# Patient Record
Sex: Female | Born: 1980 | Race: Black or African American | Hispanic: No | Marital: Single | Smoking: Current every day smoker
Health system: Southern US, Community
[De-identification: ages and names within clinical notes are randomized; demographics above are authoritative.]

## PROBLEM LIST (undated history)

## (undated) DIAGNOSIS — M549 Dorsalgia, unspecified: Secondary | ICD-10-CM

## (undated) DIAGNOSIS — G8929 Other chronic pain: Secondary | ICD-10-CM

## (undated) DIAGNOSIS — F319 Bipolar disorder, unspecified: Secondary | ICD-10-CM

## (undated) DIAGNOSIS — F419 Anxiety disorder, unspecified: Secondary | ICD-10-CM

## (undated) DIAGNOSIS — F4310 Post-traumatic stress disorder, unspecified: Secondary | ICD-10-CM

## (undated) DIAGNOSIS — K529 Noninfective gastroenteritis and colitis, unspecified: Secondary | ICD-10-CM

---

## 2005-09-26 ENCOUNTER — Emergency Department: Payer: Self-pay | Admitting: Emergency Medicine

## 2011-01-08 ENCOUNTER — Emergency Department: Payer: Self-pay | Admitting: Emergency Medicine

## 2011-02-14 ENCOUNTER — Emergency Department: Payer: Self-pay | Admitting: Emergency Medicine

## 2011-07-26 ENCOUNTER — Emergency Department: Payer: Self-pay | Admitting: Emergency Medicine

## 2011-07-26 LAB — CBC
HCT: 43.1 % (ref 35.0–47.0)
HGB: 14.3 g/dL (ref 12.0–16.0)
MCH: 30.7 pg (ref 26.0–34.0)
MCHC: 33.2 g/dL (ref 32.0–36.0)
MCV: 92 fL (ref 80–100)
RBC: 4.67 10*6/uL (ref 3.80–5.20)

## 2011-07-26 LAB — URINALYSIS, COMPLETE
Bacteria: NONE SEEN
Bilirubin,UR: NEGATIVE
Glucose,UR: NEGATIVE mg/dL (ref 0–75)
Ketone: NEGATIVE
Nitrite: NEGATIVE
Ph: 8 (ref 4.5–8.0)
Protein: NEGATIVE

## 2011-07-26 LAB — COMPREHENSIVE METABOLIC PANEL
Albumin: 3.6 g/dL (ref 3.4–5.0)
Alkaline Phosphatase: 49 U/L — ABNORMAL LOW (ref 50–136)
BUN: 9 mg/dL (ref 7–18)
Calcium, Total: 9.2 mg/dL (ref 8.5–10.1)
Chloride: 105 mmol/L (ref 98–107)
EGFR (Non-African Amer.): >60
Glucose: 89 mg/dL (ref 65–99)
Osmolality: 278 (ref 275–301)
SGPT (ALT): 29 U/L
Sodium: 140 mmol/L (ref 136–145)

## 2011-07-26 LAB — PREGNANCY, URINE: Pregnancy Test, Urine: NEGATIVE m[IU]/mL

## 2015-04-07 ENCOUNTER — Encounter (HOSPITAL_COMMUNITY): Payer: Self-pay | Admitting: Adult Health

## 2015-04-07 ENCOUNTER — Emergency Department (HOSPITAL_COMMUNITY)
Admission: EM | Admit: 2015-04-07 | Discharge: 2015-04-07 | Disposition: A | Discharge status: Home / Self Care | Payer: Medicaid Other | Attending: Emergency Medicine | Admitting: Emergency Medicine

## 2015-04-07 DIAGNOSIS — K029 Dental caries, unspecified: Secondary | ICD-10-CM | POA: Insufficient documentation

## 2015-04-07 DIAGNOSIS — R110 Nausea: Secondary | ICD-10-CM | POA: Insufficient documentation

## 2015-04-07 DIAGNOSIS — K0889 Other specified disorders of teeth and supporting structures: Secondary | ICD-10-CM | POA: Diagnosis not present

## 2015-04-07 DIAGNOSIS — K047 Periapical abscess without sinus: Secondary | ICD-10-CM | POA: Diagnosis not present

## 2015-04-07 MED ORDER — NAPROXEN 500 MG PO TABS: 500.0000 mg | ORAL_TABLET | Freq: Two times a day (BID) | ORAL | Status: DC | PRN

## 2015-04-07 MED ORDER — HYDROCODONE-ACETAMINOPHEN 5-325 MG PO TABS
1.0000 | ORAL_TABLET | Freq: Once | ORAL | Status: AC
  Administered 2015-04-07: 1 via ORAL
  Filled 2015-04-07: qty 1

## 2015-04-07 MED ORDER — HYDROCODONE-ACETAMINOPHEN 5-325 MG PO TABS: 1.0000 | ORAL_TABLET | Freq: Four times a day (QID) | ORAL | Status: DC | PRN

## 2015-04-07 MED ORDER — DOXYCYCLINE HYCLATE 100 MG PO CAPS: 100.0000 mg | ORAL_CAPSULE | Freq: Two times a day (BID) | ORAL | Status: DC

## 2015-04-07 NOTE — ED Provider Notes (Signed)
CSN: [id number]     Arrival date & time 04/07/15  2056 History  By signing my name below, I, Julien Nordmann, attest that this documentation has been prepared under the direction and in the presence of Ardis Lawley Camprubi-Soms, PA-C. Electronically Signed: Julien Nordmann, ED Scribe. 04/07/2015. 9:15 PM.    Chief Complaint  Patient presents with  . Dental Pain      Patient is a 34 y.o. female presenting with tooth pain. The history is provided by the patient. No language interpreter was used.  Dental Pain Location:  Lower Lower teeth location:  31/RL 2nd molar Quality:  Sharp, throbbing and shooting Severity:  Moderate Onset quality:  Gradual Duration:  3 days Timing:  Constant Progression:  Worsening Chronicity:  New Context: dental caries and poor dentition   Context: not abscess   Relieved by:  Nothing Worsened by:  Cold food/drink and jaw movement Ineffective treatments:  NSAIDs Associated symptoms: gum swelling   Associated symptoms: no difficulty swallowing, no drooling, no facial swelling, no fever, no neck pain, no neck swelling, no oral bleeding and no trismus   Risk factors: lack of dental care   Risk factors: no immunosuppression and no smoking    HPI Comments: SAYDA GRABLE is a 34 y.o. female who presents to the Emergency Department complaining of constant, 10/10, gradual worsening, throbbing, stabbing and shooting right lower molar pain onset 3 days ago. She has been having gum swelling and nausea that she feels is due to the pain. Pt notes the pain radiates up the right side of her face. She states that the pain is worse with movement and cold foods/fluids. Pt has tried ibuprofen to alleviate the pain with no relief. She endorses having a broken tooth on that side which is the cause of her pain. Denies trouble swallowing, drooling, trismus, gum drainage, neck swelling/pain, ear pain/drainage, sore throat, fevers, chills, CP, SOB, abd pain, V/D/C, hematuria, dysuria,  myalgias, arthralgias, numbness, tingling, weakness, or skin changes. Pt is a non smoker. Doesn't have a dentist.   No past medical history on file. No past surgical history on file. No family history on file. Social History  Substance Use Topics  . Smoking status: Not on file  . Smokeless tobacco: Not on file  . Alcohol Use: Not on file   OB History    No data available     Review of Systems  Constitutional: Negative for fever and chills.  HENT: Positive for dental problem. Negative for drooling, ear discharge, ear pain, facial swelling, sore throat and trouble swallowing.   Respiratory: Negative for shortness of breath.   Cardiovascular: Negative for chest pain.  Gastrointestinal: Positive for nausea (from pain). Negative for vomiting, abdominal pain, diarrhea and constipation.  Genitourinary: Negative for dysuria and hematuria.  Musculoskeletal: Negative for neck pain.  Skin: Negative for color change.  Allergic/Immunologic: Negative for immunocompromised state.  Neurological: Negative for weakness and numbness.   10 Systems reviewed and are negative for acute change except as noted in the HPI.    Allergies  Review of patient's allergies indicates not on file.  Home Medications   Prior to Admission medications   Not on File   Triage vitals: BP 125/80 mmHg  Pulse 85  Temp(Src) 98 F (36.7 C) (Oral)  Resp 16  Ht 5' 2"  (1.575 m)  Wt 170 lb (77.111 kg)  BMI 31.09 kg/m2  SpO2 100% Physical Exam  Constitutional: She is oriented to person, place, and time. Vital signs are normal.  She appears well-developed and well-nourished.  Non-toxic appearance. No distress.  Afebrile, nontoxic, NAD  HENT:  Head: Normocephalic and atraumatic.  Nose: Nose normal.  Mouth/Throat: Uvula is midline, oropharynx is clear and moist and mucous membranes are normal. No trismus in the jaw. Dental caries present. No dental abscesses or uvula swelling.    Right lower molar number 31 decayed  with moderate TTP, with mild surrounding gingival erythema and swelling but without evidence of abscess, no evidence of Ludwig's. Nose clear. Oropharynx clear and moist, without uvular swelling or deviation, no trismus or drooling, no tonsillar swelling or erythema, no exudates.  No facial swelling noted  Eyes: Conjunctivae and EOM are normal. Right eye exhibits no discharge. Left eye exhibits no discharge.  Neck: Normal range of motion. Neck supple.  Cardiovascular: Normal rate.   Pulmonary/Chest: Effort normal. No respiratory distress.  Abdominal: Normal appearance. She exhibits no distension.  Musculoskeletal: Normal range of motion.  Lymphadenopathy:       Head (right side): Submandibular adenopathy present.  Reactive right sided submandibular LAD which is mildly TTP.  Neurological: She is alert and oriented to person, place, and time. She has normal strength. No sensory deficit.  Skin: Skin is warm, dry and intact. No rash noted.  Psychiatric: She has a normal mood and affect. Her behavior is normal.  Nursing note and vitals reviewed.   ED Course  Procedures  DIAGNOSTIC STUDIES: Oxygen Saturation is 100% on RA, normal by my interpretation.  COORDINATION OF CARE:  9:15 PM Discussed treatment plan with pt at bedside and pt agreed to plan.    Labs Review Labs Reviewed - No data to display  Imaging Review No results found. I have personally reviewed and evaluated these images and lab results as part of my medical decision-making.   EKG Interpretation None      MDM   Final diagnoses:  Dental decay  Pain due to dental caries  Dental infection    34 y.o. female here with Dental pain associated with dental decay and possible dental infection/early abscess with patient afebrile, non toxic appearing and swallowing secretions well. I gave patient referral to dentist and stressed the importance of dental follow up for ultimate management of dental pain.  I have also discussed  reasons to return immediately to the ER.  Patient expresses understanding and agrees with plan.  I will also give doxycycline and pain control.    I personally performed the services described in this documentation, which was scribed in my presence. The recorded information has been reviewed and is accurate.  BP 125/80 mmHg  Pulse 85  Temp(Src) 98 F (36.7 C) (Oral)  Resp 16  Ht 5' 2"  (1.575 m)  Wt 77.111 kg  BMI 31.09 kg/m2  SpO2 100%  Meds ordered this encounter  Medications  . HYDROcodone-acetaminophen (NORCO/VICODIN) 5-325 MG per tablet 1 tablet    Sig:   . doxycycline (VIBRAMYCIN) 100 MG capsule    Sig: Take 1 capsule (100 mg total) by mouth 2 (two) times daily. One po bid x 7 days    Dispense:  14 capsule    Refill:  0    Order Specific Question:  Supervising Provider    Answer:  MILLER, BRIAN [[id number]]  . HYDROcodone-acetaminophen (NORCO) 5-325 MG tablet    Sig: Take 1 tablet by mouth every 6 (six) hours as needed for severe pain.    Dispense:  8 tablet    Refill:  0    Order Specific Question:  Supervising Provider    Answer:  Noemi Chapel [[id number]]  . naproxen (NAPROSYN) 500 MG tablet    Sig: Take 1 tablet (500 mg total) by mouth 2 (two) times daily as needed for mild pain, moderate pain or headache (TAKE WITH MEALS.).    Dispense:  20 tablet    Refill:  0    Order Specific Question:  Supervising Provider    Answer:  Noemi Chapel [[id number]]     Annelisa Ryback Camprubi-Soms, PA-C 04/07/15 2120  Lacretia Leigh, MD 04/09/15 0001

## 2015-04-07 NOTE — Discharge Instructions (Signed)
Apply warm compresses to jaw throughout the day. Take antibiotic until finished. Take naprosyn and norco as directed, as needed for pain but do not drive or operate machinery with pain medication use. Followup with a dentist is very important for ongoing evaluation and management of recurrent dental pain. Call the dentist above tomorrow, or use the list below to find a dentist in the area for ultimate management of your dental pain. Return to emergency department for emergent changing or worsening symptoms.   Dental Caries Dental caries is tooth decay. This decay can cause a hole in teeth (cavity) that can get bigger and deeper over time. HOME CARE  Brush and floss your teeth. Do this at least two times a day.  Use a fluoride toothpaste.  Use a mouth rinse if told by your dentist or doctor.  Eat less sugary and starchy foods. Drink less sugary drinks.  Avoid snacking often on sugary and starchy foods. Avoid sipping often on sugary drinks.  Keep regular checkups and cleanings with your dentist.  Use fluoride supplements if told by your dentist or doctor.  Allow fluoride to be applied to teeth if told by your dentist or doctor.   This information is not intended to replace advice given to you by your health care provider. Make sure you discuss any questions you have with your health care provider.   Document Released: 01/12/2008 Document Revised: 04/25/2014 Document Reviewed: 04/06/2012 Elsevier Interactive Patient Education 2016 [place] Abscess A dental abscess is pus in or around a tooth. HOME CARE  Take medicines only as told by your dentist.  If you were prescribed antibiotic medicine, finish all of it even if you start to feel better.  Rinse your mouth (gargle) often with salt water.  Do not drive or use heavy machinery, like a lawn mower, while taking pain medicine.  Do not apply heat to the outside of your mouth.  Keep all follow-up visits as told by your  dentist. This is important. GET HELP IF:  Your pain is worse, and medicine does not help. GET HELP RIGHT AWAY IF:  You have a fever or chills.  Your symptoms suddenly get worse.  You have a very bad headache.  You have problems breathing or swallowing.  You have trouble opening your mouth.  You have puffiness (swelling) in your neck or around your eye.   This information is not intended to replace advice given to you by your health care provider. Make sure you discuss any questions you have with your health care provider.   Document Released: 08/19/2014 Document Reviewed: 08/19/2014 Elsevier Interactive Patient Education [employer].   Emergency Department Resource Guide 1) Find a Doctor and Pay Out of Pocket Although you won't have to find out who is covered by your insurance plan, it is a good idea to ask around and get recommendations. You will then need to call the office and see if the doctor you have chosen will accept you as a new patient and what types of options they offer for patients who are self-pay. Some doctors offer discounts or will set up payment plans for their patients who do not have insurance, but you will need to ask so you aren't surprised when you get to your appointment.  2) Contact Your Local Health Department Not all health departments have doctors that can see patients for sick visits, but many do, so it is worth a call to see if yours does. If you don't  know where your local health department is, you can check in your phone book. The CDC also has a tool to help you locate your state's health department, and many state websites also have listings of all of their local health departments.  3) Find a [facility] If your illness is not likely to be very severe or complicated, you may want to try a walk in clinic. These are popping up all over the country in pharmacies, drugstores, and shopping centers. They're usually staffed by nurse practitioners  or physician assistants that have been trained to treat common illnesses and complaints. They're usually fairly quick and inexpensive. However, if you have serious medical issues or chronic medical problems, these are probably not your best option.  No Primary Care Doctor: - Call Health Connect at  [phone] - they can help you locate a primary care doctor that  accepts your insurance, provides certain services, etc. - Physician Referral Service- [phone]  Chronic Pain Problems: Organization         Address  Phone   Notes  [facility]  [phone] Patients need to be referred by their primary care doctor.   Medication Assistance: Organization         Address  Phone   Notes  [facility] Medication [facility] [address] [place], [place] [address] [phone] --Must be a resident of [facility] -- Must have NO insurance coverage whatsoever (no Medicaid/ Medicare, etc.) -- The pt. MUST have a primary care doctor that directs their care regularly and follows them in the community   [facility]  [phone]   [place]  [phone]     Dental Care: Organization         Address  Phone  Notes  [facility] Department of [facility] [place] [phone] Accepts children up to age 29 who are enrolled in [place] or [place]; pregnant women with a Medicaid card; and children who have applied for Medicaid or [place] Health Choice, but were declined, whose parents can pay a reduced fee at time of service.  [facility] Department of [facility]  [address], [place] [phone] Accepts children up to age 32 who are enrolled in [place] or [place]; pregnant women with a Medicaid card; and children who have applied for Medicaid or [place] Health Choice, but were declined, whose parents can pay a reduced fee at time of service.  [place] Adult [facility]  Access PROGRAM  [place] [phone] Patients are seen by appointment only. Walk-ins are not accepted. [place] will see patients 78 years of age and older. Monday - Tuesday (8am-5pm) Most Wednesdays (8:30-5pm) $30 per visit, cash only  [facility]  [address], [facility] [phone] Patients are seen by appointment only. Walk-ins are not accepted. [place] will see patients 68 years of age and older. One Wednesday Evening (Monthly: Volunteer Based).  $30 per visit, cash only  [place]  [phone] for adults; Children under age 42, call Graduate Pediatric Dentistry at [phone]. Children aged 78-14, please call [phone] to request a pediatric application.  Dental services are provided in all areas of dental care including fillings, crowns and bridges, complete and partial dentures, implants, gum treatment, root canals, and extractions. Preventive care is also provided. Treatment is provided  to both adults and children. Patients are selected via a lottery and there is often a waiting list.   [facility] [address] [place]  [phone] [email or web address]   [facility] [address] [place], [place] [phone], Ext. [phone] Second and Fourth Thursday of each month, opens at 6:30 AM; Clinic ends at 9 AM.  Patients are seen on a first-come first-served basis, and a limited number are seen during each clinic.   [facility]  [address]  [address] [place], [place] [phone]   Eligibility Requirements You must have lived in [place], [place], or [place] counties for at least the last three months.   You cannot be eligible for state or federal sponsored [employer], including [employer], [place], or [employer].   You generally cannot be eligible for healthcare insurance through your employer.    How to apply: Eligibility  screenings are held every Tuesday and Wednesday afternoon from 1:00 pm until 4:00 pm. You do not need an appointment for the interview!  [facility] [address], [place], [place]   [place]  [place]  [place]  [phone]

## 2015-04-07 NOTE — ED Notes (Signed)
presents with dental pain on right lower side, tooth is decayed and broken. Right side of jaw swollen. Pain rated 10

## 2015-05-20 ENCOUNTER — Encounter (HOSPITAL_COMMUNITY): Payer: Self-pay | Admitting: Emergency Medicine

## 2015-05-20 ENCOUNTER — Emergency Department (HOSPITAL_COMMUNITY)
Admission: EM | Admit: 2015-05-20 | Discharge: 2015-05-20 | Disposition: A | Discharge status: Home / Self Care | Payer: Medicaid Other | Attending: Emergency Medicine | Admitting: Emergency Medicine

## 2015-05-20 DIAGNOSIS — Z3202 Encounter for pregnancy test, result negative: Secondary | ICD-10-CM | POA: Insufficient documentation

## 2015-05-20 DIAGNOSIS — B3731 Acute candidiasis of vulva and vagina: Secondary | ICD-10-CM

## 2015-05-20 DIAGNOSIS — B373 Candidiasis of vulva and vagina: Secondary | ICD-10-CM | POA: Diagnosis not present

## 2015-05-20 DIAGNOSIS — N898 Other specified noninflammatory disorders of vagina: Secondary | ICD-10-CM

## 2015-05-20 DIAGNOSIS — B9689 Other specified bacterial agents as the cause of diseases classified elsewhere: Secondary | ICD-10-CM

## 2015-05-20 DIAGNOSIS — Z79899 Other long term (current) drug therapy: Secondary | ICD-10-CM | POA: Insufficient documentation

## 2015-05-20 DIAGNOSIS — N760 Acute vaginitis: Secondary | ICD-10-CM | POA: Diagnosis not present

## 2015-05-20 LAB — WET PREP, GENITAL
Sperm: NONE SEEN
Trich, Wet Prep: NONE SEEN

## 2015-05-20 LAB — URINALYSIS, ROUTINE W REFLEX MICROSCOPIC
Bilirubin Urine: NEGATIVE
GLUCOSE, UA: NEGATIVE mg/dL
Hgb urine dipstick: NEGATIVE
KETONES UR: NEGATIVE mg/dL
NITRITE: NEGATIVE
PROTEIN: NEGATIVE mg/dL
Specific Gravity, Urine: 1.024 (ref 1.005–1.030)
pH: 7.5 (ref 5.0–8.0)

## 2015-05-20 LAB — URINE MICROSCOPIC-ADD ON
Bacteria, UA: NONE SEEN
RBC / HPF: NONE SEEN RBC/hpf (ref 0–5)

## 2015-05-20 LAB — RAPID HIV SCREEN (HIV 1/2 AB+AG)
HIV 1/2 Antibodies: NONREACTIVE
HIV-1 P24 Antigen - HIV24: NONREACTIVE

## 2015-05-20 LAB — POC URINE PREG, ED: PREG TEST UR: NEGATIVE

## 2015-05-20 MED ORDER — METRONIDAZOLE 500 MG PO TABS: 500.0000 mg | ORAL_TABLET | Freq: Two times a day (BID) | ORAL | Status: DC

## 2015-05-20 MED ORDER — LIDOCAINE HCL (PF) 1 % IJ SOLN
INTRAMUSCULAR | Status: AC
  Administered 2015-05-20: 5 mL
  Filled 2015-05-20: qty 5

## 2015-05-20 MED ORDER — FLUCONAZOLE 150 MG PO TABS: 150.0000 mg | ORAL_TABLET | Freq: Once | ORAL | Status: AC

## 2015-05-20 MED ORDER — AZITHROMYCIN 250 MG PO TABS
1000.0000 mg | ORAL_TABLET | Freq: Once | ORAL | Status: AC
  Administered 2015-05-20: 1000 mg via ORAL
  Filled 2015-05-20: qty 4

## 2015-05-20 MED ORDER — FLUCONAZOLE 100 MG PO TABS
150.0000 mg | ORAL_TABLET | Freq: Once | ORAL | Status: AC
  Administered 2015-05-20: 150 mg via ORAL
  Filled 2015-05-20: qty 2

## 2015-05-20 MED ORDER — METRONIDAZOLE 500 MG PO TABS
500.0000 mg | ORAL_TABLET | Freq: Once | ORAL | Status: AC
  Administered 2015-05-20: 500 mg via ORAL
  Filled 2015-05-20: qty 1

## 2015-05-20 MED ORDER — CEFTRIAXONE SODIUM 250 MG IJ SOLR
250.0000 mg | Freq: Once | INTRAMUSCULAR | Status: AC
  Administered 2015-05-20: 250 mg via INTRAMUSCULAR
  Filled 2015-05-20: qty 250

## 2015-05-20 NOTE — ED Notes (Signed)
Pt reports vaginal discharge and discomfort x 2 months. Pt alert x4. NAD at this time.

## 2015-05-20 NOTE — ED Provider Notes (Signed)
CSN: [id number]     Arrival date & time 05/20/15  1019 History   First MD Initiated Contact with Patient 05/20/15 1043     Chief Complaint  Patient presents with  . Vaginal Discharge     (Consider location/radiation/quality/duration/timing/severity/associated sxs/prior Treatment) HPI   Annette Snyder is a 35 y.o. female, patient with no pertinent past medical history, presenting to the ED with vaginal and labial irritation for the past month. Patient also complains of vaginal and vulval itching and "crusting." Patient adds that she has had recurrent yeast infections for the past 2 months at least. Patient complains of occasional milky white discharge. Patient is new to the area, does not yet have a PCP or OB/GYN, and is currently going through a divorce. Patient states that her spouse was unfaithful to her, but does not know about his exposure to any STDs. Patient denies abdominal or pelvic pain, nausea/vomiting, fever/chills, urinary complaints, or any other complaints.  History reviewed. No pertinent past medical history. History reviewed. No pertinent past surgical history. No family history on file. Social History  Substance Use Topics  . Smoking status: Never Smoker   . Smokeless tobacco: None  . Alcohol Use: No   OB History    No data available     Review of Systems  Constitutional: Negative for fever, chills and diaphoresis.  Gastrointestinal: Negative for abdominal pain.  Genitourinary: Positive for vaginal discharge. Negative for vaginal bleeding and vaginal pain.       Vulvovaginal irritation and itching.  All other systems reviewed and are negative.     Allergies  Review of patient's allergies indicates no known allergies.  Home Medications   Prior to Admission medications   Medication Sig Start Date End Date Taking? Authorizing Provider  lisdexamfetamine (VYVANSE) 20 MG capsule Take 20 mg by mouth daily.   Yes Historical Provider, MD  fluconazole (DIFLUCAN)  150 MG tablet Take 1 tablet (150 mg total) by mouth once. Only take this medication if irritation and itching persists after 72 hours from your first dose given in the ED. 05/23/15 05/30/15  Shawn C Joy, PA-C  metroNIDAZOLE (FLAGYL) 500 MG tablet Take 1 tablet (500 mg total) by mouth 2 (two) times daily. 05/20/15   Shawn C Joy, PA-C   BP 106/66 mmHg  Pulse 54  Temp(Src) 97.9 F (36.6 C) (Oral)  Resp 14  SpO2 100%  LMP 05/10/2015 (Exact Date) Physical Exam  Constitutional: She appears well-developed and well-nourished. No distress.  HENT:  Head: Normocephalic and atraumatic.  Eyes: Conjunctivae are normal. Pupils are equal, round, and reactive to light.  Cardiovascular: Normal rate, regular rhythm and normal heart sounds.   Pulmonary/Chest: Effort normal and breath sounds normal. No respiratory distress.  Abdominal: Soft. Bowel sounds are normal. There is no tenderness.  Genitourinary:  External genitalia abnormal - scant milky white discharge coating external genitalia. Readily scrapable. No lesions noted. Vagina with discharge - moderate, thick, milky white discharge with a green tint noted in the vaginal vault. Cervix  abnormal - cervix is strawberry red and friable. negative for cervical motion tenderness Adnexa palpated, no masses or negative for tenderness noted Bladder palpated negative for tenderness Uterus palpated no masses or negative for tenderness Otherwise normal female genitalia. RN, Annette Snyder, served as chaperone during exam.  Musculoskeletal: She exhibits no edema or tenderness.  Neurological: She is alert.  Skin: Skin is warm and dry. She is not diaphoretic.  Nursing note and vitals reviewed.   ED Course  Procedures (  including critical care time) Labs Review Labs Reviewed  WET PREP, GENITAL - Abnormal; Notable for the following:    Yeast Wet Prep HPF POC PRESENT (*)    Clue Cells Wet Prep HPF POC PRESENT (*)    WBC, Wet Prep HPF POC PRESENT (*)    All other  components within normal limits  URINALYSIS, ROUTINE W REFLEX MICROSCOPIC (NOT AT [facility]) - Abnormal; Notable for the following:    APPearance CLOUDY (*)    Leukocytes, UA SMALL (*)    All other components within normal limits  URINE MICROSCOPIC-ADD ON - Abnormal; Notable for the following:    Squamous Epithelial / LPF 0-5 (*)    All other components within normal limits  URINE CULTURE  RAPID HIV SCREEN (HIV 1/2 AB+AG)  RPR  POC URINE PREG, ED  GC/CHLAMYDIA PROBE AMP ([place]) NOT AT [facility]    I have personally reviewed and evaluated these lab results as part of my medical decision-making.   EKG Interpretation None      MDM   Final diagnoses:  Vaginal itching  Vaginal discharge  Bacterial vaginosis  Vulvovaginal candidiasis    Annette Snyder presents with vulvovaginal irritation, itching, and occasional discharge for the last month.  This patient's presentation is consistent with possible STI versus candidiasis or bacterial vaginosis. Patient will be tested with the STD panel and given suggestions for local PCPs. Patient will be treated prophylactically for GC/chlamydia. Patient was informed that her results of her GC/chlamydia with take a number of days. Results of the patient's wet prep and HIV were delivered to her. Patient to be treated for bacterial vaginosis and vulvovaginal candidiasis.  Filed Vitals:   05/20/15 1100 05/20/15 1127 05/20/15 1130 05/20/15 1200  BP: 100/55 100/55 111/71 106/66  Pulse: 57 60 54 54  Temp:      TempSrc:      Resp:  14    SpO2: 100% 100% 100% 100%     Lorayne Bender, PA-C 05/20/15 1226  Dorie Rank, MD 05/20/15 1654

## 2015-05-20 NOTE — Discharge Instructions (Signed)
You have been seen today for vaginal irritation and discharge. You have been treated prophylactically for gonorrhea and chlamydia. Follow up with PCP as needed. Use the [facility] and [employer] or the list of providers below to select a PCP. Return to ED should symptoms worsen.   Emergency Department Resource Guide 1) Find a Doctor and Pay Out of Pocket Although you won't have to find out who is covered by your insurance plan, it is a good idea to ask around and get recommendations. You will then need to call the office and see if the doctor you have chosen will accept you as a new patient and what types of options they offer for patients who are self-pay. Some doctors offer discounts or will set up payment plans for their patients who do not have insurance, but you will need to ask so you aren't surprised when you get to your appointment.  2) Contact Your Local Health Department Not all health departments have doctors that can see patients for sick visits, but many do, so it is worth a call to see if yours does. If you don't know where your local health department is, you can check in your phone book. The CDC also has a tool to help you locate your state's health department, and many state websites also have listings of all of their local health departments.  3) Find a [facility] If your illness is not likely to be very severe or complicated, you may want to try a walk in clinic. These are popping up all over the country in pharmacies, drugstores, and shopping centers. They're usually staffed by nurse practitioners or physician assistants that have been trained to treat common illnesses and complaints. They're usually fairly quick and inexpensive. However, if you have serious medical issues or chronic medical problems, these are probably not your best option.  No Primary Care Doctor: - Call Health Connect at  [phone] - they can help you locate a primary care doctor that  accepts your  insurance, provides certain services, etc. - Physician Referral Service- [phone]  Chronic Pain Problems: Organization         Address  Phone   Notes  [facility]  [phone] Patients need to be referred by their primary care doctor.   Medication Assistance: Organization         Address  Phone   Notes  [facility] Medication [facility] [address] [place], [place] [address] [phone] --Must be a resident of [facility] -- Must have NO insurance coverage whatsoever (no Medicaid/ Medicare, etc.) -- The pt. MUST have a primary care doctor that directs their care regularly and follows them in the community   MedAssist  [phone]   [employer]  [phone]    Agencies that provide inexpensive medical care: Organization         Address  Phone   Notes  [place]  [phone]   [facility]    [phone]   [facility] [place], [place] [address] [phone]   [place] [address], [place] [phone]   Planned Parenthood    [phone]   [facility]    [phone]   [place] [address], [place] Phone:  [phone], Fax:  [phone] Hours of Operation:  9 am -  6 pm, M-F.  Also accepts Medicaid/Medicare and self-pay.  [facility] for [address], [address], [place] Phone: [phone], Fax: [phone]. Hours of Operation:  8:30 am - 5:30 pm, M-F.  Also accepts Medicaid and self-pay.  [facility] [place] [address] [place] Phone: [phone]   [place], [place], [place] [phone], Ext. [phone] Mondays & Thursdays: 7-9 AM.  First 15 patients are seen on a first come, first serve basis.    [place] Providers:  Organization          Address  Phone   Notes  [facility] [address], [place] [phone] Also accepts self-pay patients.  [facility] [address], [place]  [phone]   [place], [address], [place] [phone]   [facility] [address] [place] [phone]   Lucianne Lei [address], [place]   [phone] Only accepts [place] Access [place] patients after they have their name applied to their card.   Self-Pay (no insurance) in [facility]:  Organization         Address  Phone   Notes  Sickle Cell Patients, [facility] [place] [phone]   [facility] [place] [phone]   [facility] [place]  [place], [place], [place] [phone]   [facility]/Dr. Osei-Bonsu  [address] [place] or [address], [place] [phone] Phone number for both [place] and [place] locations is the same.  [facility]gent [facility] and [facility] [address], [place] [phone]   [facility] [address], [place] or [address] [phone]   [facility] [address], [place] [phone], phone; [phone], fax Sees patients 1st and 3rd Saturday of every month.  Must not qualify for public or private insurance (i.e. Medicaid, Medicare, [place] Health Choice, Veterans' Benefits)  Household income should be no more than 200% of the poverty level The clinic cannot treat you if you are pregnant or think you are pregnant  Sexually transmitted diseases are not treated at the clinic.    Dental Care: Organization         Address  Phone  Notes  [facility] Regino Ramirez [phone] Accepts children up to age 11 who are enrolled in [place] or [place]; pregnant women with a Medicaid card; and children who have applied for Medicaid or  Health Choice, but were declined, whose parents can pay a reduced fee at time of service.  [facility] Department of [facility]  [address], [place] [phone] Accepts children up to age 61 who are enrolled in [place] or [place]; pregnant women with a Medicaid card; and children who have applied for Medicaid or  Health Choice, but were declined, whose parents can pay a reduced fee at time of service.  [place] Adult [facility] Access PROGRAM  [place] [phone] Patients are seen by appointment only. Walk-ins are not accepted. [place] will see patients 81 years of age and  older. Monday - Tuesday (8am-5pm) Most Wednesdays (8:30-5pm) $30 per visit, cash only  [facility] Adult [employer] PROGRAM  [address], [facility] [phone] Patients are seen by appointment only. Walk-ins are not accepted. [place] will see patients 82 years of age and older. One Wednesday Evening (Monthly: Volunteer Based).  $30 per visit, cash only  [place]  [phone] for adults; Children under age 31, call Graduate Pediatric Dentistry at [phone]. Children aged 17-14, please call [phone] to request a pediatric application.  Dental services are provided in all areas of dental care including fillings, crowns and bridges, complete and partial dentures, implants, gum treatment, root canals, and extractions. Preventive care is also provided. Treatment is provided to both adults and children. Patients are selected via a lottery and there is often a waiting list.   [facility] [address], [place]  [phone] [email or web address]   [facility] [address] [place], [place]  [phone], Ext. [phone] Second and Fourth Thursday of each month, opens at 6:30 AM; Clinic ends at 9 AM.  Patients are seen on a first-come first-served basis, and a limited number are seen during each clinic.   [facility]  [address] Hillard Danker [place], [place] [phone]   Eligibility Requirements You must have lived in [place], [place], or [place] counties for at least the last three months.   You cannot be eligible for state or federal sponsored [employer], including [employer], [place], or [employer].   You generally cannot be eligible for healthcare insurance through your employer.    How to apply: Eligibility screenings are held every Tuesday and Wednesday afternoon from 1:00 pm until 4:00 pm. You do not need an appointment for the interview!  [facility] [address], [place], [place]   [place]  [place] Department  [place]  [phone]    Behavioral Health Resources in the Community: Intensive Outpatient Programs Organization         Address  Phone  Notes  [facility] [place]. [address], [place], [place] [phone]   [facility] Outpatient [address] [place], [place]   ADS: Alcohol & Drug Svcs [address], [place], [place]   [place] [address],  [place], [place] or [phone]   Substance Abuse Resources Organization         Address  Phone  Notes  Alcohol and Drug Services  [phone]   [place]  [phone]   [facility] [place]   Chinita Pester  [phone]   Residential & Outpatient Substance Abuse Program  [phone]   Psychological Services Organization         Address  Phone  Notes  [facility] [place]  [place]  [phone]    [place] [address], [place] [phone] or [phone]    Mobile Crisis Teams Organization         Address  Phone  Notes  [facility], Mobile Crisis Care Unit  [phone]   [facility]  [address] [place], [place]   [facility] [address], [place] [phone]    Self-Help/Support Groups Organization         Address  Phone             Notes  [facility] of [place] - variety of support groups  [place] Call for more information  Narcotics Anonymous (NA), [facility] [address], [employer] [place]  2 meetings at this location   Special educational needs teacher         Address  Phone  Notes  [facility]idential [facility] [place],    [place]  [phone]   [facility]  [address], [place] [id number], [place], [place]   [place], [place] [phone] Admissions: 8am-3pm M-F  Incentives Substance [place] [address]    [place], [place] [phone]   [facility] [address] [place], [place], [place]   [facility] [address],  [place], [place]   [facility] Programs - Intensive Outpatient [address] Kristeen Mans [address], [place], [place]   [facility] [place].,  [place], [place] [phone] or [phone]   [facility] ([facility]) [address], [place], [place] Accepts Medicaid  [facility] [address],  [place] [phone] Substance Abuse/Addiction Treatment   [facility] Organization         Address  Phone  Notes  [facility]  [phone]   Domenic Schwab, PhD [address] [place]lis [place], [place]   [phone] or [phone]   [place]  [place], [place] [phone]   [facility] [address] [place], [place] [phone] Insurance/Medicaid/sponsorship through [facility] and Families [address], [address] [place]                                    [place], [place] [phone] [place] [address], [place] [phone]    Dr. Adele Schilder  [phone]   [facility] Dept. 1) [address] [place] 2) [place] 3)  [place] [address], [place] [phone]  [phone]   [place] [phone] or [phone] (After Hours)

## 2015-05-20 NOTE — ED Notes (Signed)
Pt is in stable condition upon d/c and ambulates from ED. 

## 2015-05-21 LAB — URINE CULTURE

## 2015-05-21 LAB — RPR: RPR Ser Ql: NONREACTIVE

## 2015-05-21 LAB — GC/CHLAMYDIA PROBE AMP (~~LOC~~) NOT AT ARMC
CHLAMYDIA, DNA PROBE: NEGATIVE
Neisseria Gonorrhea: NEGATIVE

## 2016-03-24 ENCOUNTER — Emergency Department (HOSPITAL_COMMUNITY)
Admission: EM | Admit: 2016-03-24 | Discharge: 2016-03-24 | Disposition: A | Discharge status: Home / Self Care | Payer: Medicaid Other | Attending: Emergency Medicine | Admitting: Emergency Medicine

## 2016-03-24 ENCOUNTER — Encounter (HOSPITAL_COMMUNITY): Payer: Self-pay | Admitting: Emergency Medicine

## 2016-03-24 ENCOUNTER — Emergency Department (HOSPITAL_COMMUNITY): Payer: Medicaid Other

## 2016-03-24 DIAGNOSIS — R1110 Vomiting, unspecified: Secondary | ICD-10-CM | POA: Insufficient documentation

## 2016-03-24 DIAGNOSIS — Z5321 Procedure and treatment not carried out due to patient leaving prior to being seen by health care provider: Secondary | ICD-10-CM | POA: Diagnosis not present

## 2016-03-24 DIAGNOSIS — R51 Headache: Secondary | ICD-10-CM | POA: Diagnosis not present

## 2016-03-24 DIAGNOSIS — M6281 Muscle weakness (generalized): Secondary | ICD-10-CM | POA: Diagnosis not present

## 2016-03-24 HISTORY — DX: Bipolar disorder, unspecified: F31.9

## 2016-03-24 HISTORY — DX: Dorsalgia, unspecified: M54.9

## 2016-03-24 HISTORY — DX: Anxiety disorder, unspecified: F41.9

## 2016-03-24 HISTORY — DX: Post-traumatic stress disorder, unspecified: F43.10

## 2016-03-24 HISTORY — DX: Other chronic pain: G89.29

## 2016-03-24 LAB — CBC
HEMATOCRIT: 26 % — AB (ref 36.0–46.0)
HEMOGLOBIN: 7.8 g/dL — AB (ref 12.0–15.0)
MCH: 21.5 pg — ABNORMAL LOW (ref 26.0–34.0)
MCHC: 30 g/dL (ref 30.0–36.0)
MCV: 71.6 fL — AB (ref 78.0–100.0)
Platelets: 416 10*3/uL — ABNORMAL HIGH (ref 150–400)
RBC: 3.63 MIL/uL — ABNORMAL LOW (ref 3.87–5.11)
RDW: 19.1 % — AB (ref 11.5–15.5)
WBC: 8.7 10*3/uL (ref 4.0–10.5)

## 2016-03-24 LAB — BASIC METABOLIC PANEL
ANION GAP: 9 (ref 5–15)
BUN: 10 mg/dL (ref 6–20)
CHLORIDE: 101 mmol/L (ref 101–111)
CO2: 22 mmol/L (ref 22–32)
Calcium: 8.9 mg/dL (ref 8.9–10.3)
Creatinine, Ser: 0.73 mg/dL (ref 0.44–1.00)
GFR calc Af Amer: >60 mL/min (ref >60)
GFR calc non Af Amer: >60 mL/min (ref >60)
GLUCOSE: 141 mg/dL — AB (ref 65–99)
POTASSIUM: 4 mmol/L (ref 3.5–5.1)
Sodium: 132 mmol/L — ABNORMAL LOW (ref 135–145)

## 2016-03-24 LAB — I-STAT TROPONIN, ED: Troponin i, poc: 0 ng/mL (ref 0.00–0.08)

## 2016-03-24 NOTE — ED Notes (Signed)
Pt turned in stickers and told staff she was leaving to follow up with her PCP

## 2016-03-24 NOTE — ED Triage Notes (Signed)
Pt reports new onset chest pain while waiting in lobby. Pain is constant, worse with inspiration.

## 2016-03-24 NOTE — ED Triage Notes (Signed)
Pt c/o headache, emesis x 2, bilateral generalized weakness, chills, shaking onset today. Reports this feels like her typical migraine. Also c/o ongoing back pain.  Weakness to left side, left arm and leg, left pronator drift. Pt reports this is chronic since June, neurologist states this is due to pinched nerve.

## 2016-05-17 ENCOUNTER — Ambulatory Visit: Payer: Medicaid Other | Admitting: Physical Medicine & Rehabilitation

## 2016-05-17 ENCOUNTER — Encounter: Payer: Medicaid Other | Attending: Physical Medicine & Rehabilitation

## 2016-09-06 DIAGNOSIS — K51311 Ulcerative (chronic) rectosigmoiditis with rectal bleeding: Secondary | ICD-10-CM | POA: Insufficient documentation

## 2017-02-09 ENCOUNTER — Emergency Department (HOSPITAL_BASED_OUTPATIENT_CLINIC_OR_DEPARTMENT_OTHER)
Admission: EM | Admit: 2017-02-09 | Discharge: 2017-02-10 | Disposition: A | Discharge status: Home / Self Care | Payer: Medicaid Other | Attending: Emergency Medicine | Admitting: Emergency Medicine

## 2017-02-09 ENCOUNTER — Encounter (HOSPITAL_BASED_OUTPATIENT_CLINIC_OR_DEPARTMENT_OTHER): Payer: Self-pay

## 2017-02-09 DIAGNOSIS — N3000 Acute cystitis without hematuria: Secondary | ICD-10-CM | POA: Diagnosis not present

## 2017-02-09 DIAGNOSIS — B3731 Acute candidiasis of vulva and vagina: Secondary | ICD-10-CM

## 2017-02-09 DIAGNOSIS — Z331 Pregnant state, incidental: Secondary | ICD-10-CM | POA: Insufficient documentation

## 2017-02-09 DIAGNOSIS — B373 Candidiasis of vulva and vagina: Secondary | ICD-10-CM | POA: Diagnosis not present

## 2017-02-09 DIAGNOSIS — Z79899 Other long term (current) drug therapy: Secondary | ICD-10-CM | POA: Diagnosis not present

## 2017-02-09 DIAGNOSIS — N898 Other specified noninflammatory disorders of vagina: Secondary | ICD-10-CM | POA: Diagnosis present

## 2017-02-09 HISTORY — DX: Noninfective gastroenteritis and colitis, unspecified: K52.9

## 2017-02-09 LAB — URINALYSIS, ROUTINE W REFLEX MICROSCOPIC
Bilirubin Urine: NEGATIVE
Glucose, UA: NEGATIVE mg/dL
Ketones, ur: 15 mg/dL — AB
NITRITE: NEGATIVE
PROTEIN: NEGATIVE mg/dL
Specific Gravity, Urine: 1.015 (ref 1.005–1.030)
pH: 6 (ref 5.0–8.0)

## 2017-02-09 LAB — WET PREP, GENITAL
CLUE CELLS WET PREP: NONE SEEN
Sperm: NONE SEEN
Trich, Wet Prep: NONE SEEN

## 2017-02-09 LAB — URINALYSIS, MICROSCOPIC (REFLEX)

## 2017-02-09 LAB — PREGNANCY, URINE: PREG TEST UR: POSITIVE — AB

## 2017-02-09 NOTE — ED Provider Notes (Signed)
[facility] Provider Note   CSN: [id number] Arrival date & time: 02/09/17  2320     History   Chief Complaint Chief Complaint  Patient presents with  . Vaginal Discharge    HPI Annette Snyder is a 36 y.o. female who presents the emergency department chief complaint of vaginal itching.  Patient states that she just recently found out that she is pregnant.  Her last menstrual period was 01/12/2017.  The patient began having discharge and vaginal itching.  She states it is consistent with previous yeast infections.  She denies any recent antibiotic use.  She denies foul odor, pain or dyspareunia.  She denies any urinary symptoms.  HPI  Past Medical History:  Diagnosis Date  . Anxiety   . Bipolar disorder ([place])   . Chronic back pain   . Colitis   . PTSD (post-traumatic stress disorder)     There are no active problems to display for this patient.   History reviewed. No pertinent surgical history.  OB History    No data available       Home Medications    Prior to Admission medications   Medication Sig Start Date End Date Taking? Authorizing Provider  lisdexamfetamine (VYVANSE) 20 MG capsule Take 20 mg by mouth daily.    [provider]  metroNIDAZOLE (FLAGYL) 500 MG tablet Take 1 tablet (500 mg total) by mouth 2 (two) times daily. 05/20/15   Joy, Helane Gunther, PA-C    Family History No family history on file.  Social History Social History  Substance Use Topics  . Smoking status: Never Smoker  . Smokeless tobacco: Not on file  . Alcohol use No     Allergies   Flagyl [metronidazole] and Seroquel [quetiapine]   Review of Systems Review of Systems Ten systems reviewed and are negative for acute change, except as noted in the HPI.    Physical Exam Updated Vital Signs BP 121/63 (BP Location: Left Arm)   Pulse 85   Temp 98.6 F (37 C) (Oral)   Resp 18   Ht 5' 2"  (1.575 m)   Wt 86.2 kg (190 lb)   LMP 01/12/2017    SpO2 100%   BMI 34.75 kg/m   Physical Exam  Constitutional: She is oriented to person, place, and time. She appears well-developed and well-nourished. No distress.  HENT:  Head: Normocephalic and atraumatic.  Eyes: Conjunctivae are normal. No scleral icterus.  Neck: Normal range of motion.  Cardiovascular: Normal rate, regular rhythm and normal heart sounds.  Exam reveals no gallop and no friction rub.   No murmur heard. Pulmonary/Chest: Effort normal and breath sounds normal. No respiratory distress.  Abdominal: Soft. Bowel sounds are normal. She exhibits no distension and no mass. There is no tenderness. There is no guarding.  Genitourinary:  Genitourinary Comments: Pelvic exam: VULVA: normal appearing vulva with no masses, tenderness or lesions, VAGINA: vaginal discharge - white, copious and thin, CERVIX: no discharge noted, DNA probe for chlamydia and GC obtained, cervical motion tenderness absent, multiparous os, Nabothian cysts, UTERUS: uterus is normal size, shape, consistency and nontender, ADNEXA: normal adnexa in size, nontender and no masses.   Neurological: She is alert and oriented to person, place, and time.  Skin: Skin is warm and dry. She is not diaphoretic.  Psychiatric: Her behavior is normal.  Nursing note and vitals reviewed.    ED Treatments / Results  Labs (all labs ordered are listed, but only abnormal results are displayed)  Labs Reviewed  URINALYSIS, ROUTINE W REFLEX MICROSCOPIC - Abnormal; Notable for the following:       Result Value   APPearance CLOUDY (*)    Hgb urine dipstick SMALL (*)    Ketones, ur 15 (*)    Leukocytes, UA LARGE (*)    All other components within normal limits  PREGNANCY, URINE - Abnormal; Notable for the following:    Preg Test, Ur POSITIVE (*)    All other components within normal limits  URINALYSIS, MICROSCOPIC (REFLEX) - Abnormal; Notable for the following:    Bacteria, UA FEW (*)    Squamous Epithelial / LPF 6-30 (*)    All  other components within normal limits  WET PREP, GENITAL  HCG, QUANTITATIVE, PREGNANCY  GC/CHLAMYDIA PROBE AMP ([place]) NOT AT [facility]    EKG  EKG Interpretation None       Radiology No results found.  Procedures Procedures (including critical care time)  Medications Ordered in ED Medications - No data to display   Initial Impression / Assessment and Plan / ED Course  I have reviewed the triage vital signs and the nursing notes.  Pertinent labs & imaging results that were available during my care of the patient were reviewed by me and considered in my medical decision making (see chart for details).     Patient with UTi and yeast vaginitis. Patient will be discharged with keflex and vaginal clotrimazole.  F/u with ob/gyn.  Final Clinical Impressions(s) / ED Diagnoses   Final diagnoses:  Vaginal yeast infection  Acute cystitis without hematuria    New Prescriptions New Prescriptions   No medications on file     Margarita Mail, PA-C 02/10/17 0057    Shanon Rosser, MD 02/10/17 [phone]

## 2017-02-09 NOTE — ED Triage Notes (Signed)
Pt c/o vaginal itching, pain and discharge for the last couple of days, had a positive pregnancy test at home, denies abdominal pain, denies bleeding

## 2017-02-10 LAB — HCG, QUANTITATIVE, PREGNANCY: hCG, Beta Chain, Quant, S: 1314 m[IU]/mL — ABNORMAL HIGH (ref <5)

## 2017-02-10 MED ORDER — CEPHALEXIN 500 MG PO CAPS: ORAL_CAPSULE | ORAL | 0 refills | Status: DC

## 2017-02-10 MED ORDER — CLOTRIMAZOLE 1 % VA CREA: 1.0000 | TOPICAL_CREAM | Freq: Every day | VAGINAL | 1 refills | Status: DC

## 2017-02-10 NOTE — ED Notes (Signed)
Family at bedside. 

## 2017-02-10 NOTE — Discharge Instructions (Signed)
Contact a health care provider if: You have abdominal pain. You have a fever. You have symptoms that last for more than 2-3 days. Get help right away if: You have a fever and your symptoms suddenly get worse.

## 2017-02-13 LAB — GC/CHLAMYDIA PROBE AMP (~~LOC~~) NOT AT ARMC
CHLAMYDIA, DNA PROBE: NEGATIVE
NEISSERIA GONORRHEA: NEGATIVE

## 2017-02-15 DIAGNOSIS — K51311 Ulcerative (chronic) rectosigmoiditis with rectal bleeding: Secondary | ICD-10-CM | POA: Diagnosis not present

## 2017-02-15 DIAGNOSIS — F419 Anxiety disorder, unspecified: Secondary | ICD-10-CM | POA: Diagnosis not present

## 2017-02-15 DIAGNOSIS — Z3490 Encounter for supervision of normal pregnancy, unspecified, unspecified trimester: Secondary | ICD-10-CM | POA: Diagnosis not present

## 2017-02-15 DIAGNOSIS — Z3201 Encounter for pregnancy test, result positive: Secondary | ICD-10-CM | POA: Diagnosis not present

## 2017-02-15 DIAGNOSIS — L723 Sebaceous cyst: Secondary | ICD-10-CM | POA: Diagnosis not present

## 2017-05-11 DIAGNOSIS — Z3A17 17 weeks gestation of pregnancy: Secondary | ICD-10-CM | POA: Diagnosis not present

## 2017-05-11 DIAGNOSIS — O360120 Maternal care for anti-D [Rh] antibodies, second trimester, not applicable or unspecified: Secondary | ICD-10-CM | POA: Diagnosis not present

## 2017-09-09 DIAGNOSIS — R0602 Shortness of breath: Secondary | ICD-10-CM | POA: Diagnosis not present

## 2017-12-29 DIAGNOSIS — R001 Bradycardia, unspecified: Secondary | ICD-10-CM | POA: Diagnosis not present

## 2017-12-29 DIAGNOSIS — K5190 Ulcerative colitis, unspecified, without complications: Secondary | ICD-10-CM | POA: Diagnosis not present

## 2017-12-29 DIAGNOSIS — Z3202 Encounter for pregnancy test, result negative: Secondary | ICD-10-CM | POA: Diagnosis not present

## 2017-12-29 DIAGNOSIS — N760 Acute vaginitis: Secondary | ICD-10-CM | POA: Diagnosis not present

## 2017-12-29 DIAGNOSIS — R1032 Left lower quadrant pain: Secondary | ICD-10-CM | POA: Diagnosis not present

## 2017-12-29 DIAGNOSIS — K51911 Ulcerative colitis, unspecified with rectal bleeding: Secondary | ICD-10-CM | POA: Diagnosis not present

## 2017-12-29 DIAGNOSIS — B9689 Other specified bacterial agents as the cause of diseases classified elsewhere: Secondary | ICD-10-CM | POA: Diagnosis not present

## 2017-12-29 DIAGNOSIS — R109 Unspecified abdominal pain: Secondary | ICD-10-CM | POA: Diagnosis not present

## 2018-01-03 DIAGNOSIS — F3181 Bipolar II disorder: Secondary | ICD-10-CM | POA: Diagnosis not present

## 2018-01-03 DIAGNOSIS — F4312 Post-traumatic stress disorder, chronic: Secondary | ICD-10-CM | POA: Diagnosis not present

## 2018-01-10 DIAGNOSIS — F3181 Bipolar II disorder: Secondary | ICD-10-CM | POA: Diagnosis not present

## 2018-01-10 DIAGNOSIS — F4312 Post-traumatic stress disorder, chronic: Secondary | ICD-10-CM | POA: Diagnosis not present

## 2018-01-16 DIAGNOSIS — F909 Attention-deficit hyperactivity disorder, unspecified type: Secondary | ICD-10-CM | POA: Diagnosis not present

## 2018-01-16 DIAGNOSIS — Z79899 Other long term (current) drug therapy: Secondary | ICD-10-CM | POA: Diagnosis not present

## 2018-02-07 DIAGNOSIS — F3181 Bipolar II disorder: Secondary | ICD-10-CM | POA: Diagnosis not present

## 2018-02-07 DIAGNOSIS — F4312 Post-traumatic stress disorder, chronic: Secondary | ICD-10-CM | POA: Diagnosis not present

## 2018-02-09 DIAGNOSIS — Z79899 Other long term (current) drug therapy: Secondary | ICD-10-CM | POA: Diagnosis not present

## 2018-02-09 DIAGNOSIS — F909 Attention-deficit hyperactivity disorder, unspecified type: Secondary | ICD-10-CM | POA: Diagnosis not present

## 2018-02-19 DIAGNOSIS — R5383 Other fatigue: Secondary | ICD-10-CM | POA: Diagnosis not present

## 2018-02-19 DIAGNOSIS — Z3202 Encounter for pregnancy test, result negative: Secondary | ICD-10-CM | POA: Diagnosis not present

## 2018-02-19 DIAGNOSIS — R531 Weakness: Secondary | ICD-10-CM | POA: Diagnosis not present

## 2018-02-21 DIAGNOSIS — F3181 Bipolar II disorder: Secondary | ICD-10-CM | POA: Diagnosis not present

## 2018-02-21 DIAGNOSIS — F4312 Post-traumatic stress disorder, chronic: Secondary | ICD-10-CM | POA: Diagnosis not present

## 2018-03-26 DIAGNOSIS — F909 Attention-deficit hyperactivity disorder, unspecified type: Secondary | ICD-10-CM | POA: Diagnosis not present

## 2018-03-26 DIAGNOSIS — Z79899 Other long term (current) drug therapy: Secondary | ICD-10-CM | POA: Diagnosis not present

## 2018-03-28 DIAGNOSIS — F4312 Post-traumatic stress disorder, chronic: Secondary | ICD-10-CM | POA: Diagnosis not present

## 2018-03-28 DIAGNOSIS — F3181 Bipolar II disorder: Secondary | ICD-10-CM | POA: Diagnosis not present

## 2018-04-04 DIAGNOSIS — F909 Attention-deficit hyperactivity disorder, unspecified type: Secondary | ICD-10-CM | POA: Diagnosis not present

## 2018-04-04 DIAGNOSIS — F3181 Bipolar II disorder: Secondary | ICD-10-CM | POA: Diagnosis not present

## 2018-04-04 DIAGNOSIS — Z79899 Other long term (current) drug therapy: Secondary | ICD-10-CM | POA: Diagnosis not present

## 2018-04-04 DIAGNOSIS — F4312 Post-traumatic stress disorder, chronic: Secondary | ICD-10-CM | POA: Diagnosis not present

## 2018-04-12 DIAGNOSIS — F909 Attention-deficit hyperactivity disorder, unspecified type: Secondary | ICD-10-CM | POA: Diagnosis not present

## 2018-04-12 DIAGNOSIS — Z79899 Other long term (current) drug therapy: Secondary | ICD-10-CM | POA: Diagnosis not present

## 2018-04-25 DIAGNOSIS — F4312 Post-traumatic stress disorder, chronic: Secondary | ICD-10-CM | POA: Diagnosis not present

## 2018-04-25 DIAGNOSIS — F3181 Bipolar II disorder: Secondary | ICD-10-CM | POA: Diagnosis not present

## 2018-05-22 DIAGNOSIS — Z79899 Other long term (current) drug therapy: Secondary | ICD-10-CM | POA: Diagnosis not present

## 2018-05-22 DIAGNOSIS — F909 Attention-deficit hyperactivity disorder, unspecified type: Secondary | ICD-10-CM | POA: Diagnosis not present

## 2018-06-06 DIAGNOSIS — F3181 Bipolar II disorder: Secondary | ICD-10-CM | POA: Diagnosis not present

## 2018-06-06 DIAGNOSIS — F4312 Post-traumatic stress disorder, chronic: Secondary | ICD-10-CM | POA: Diagnosis not present

## 2018-06-22 DIAGNOSIS — Z79899 Other long term (current) drug therapy: Secondary | ICD-10-CM | POA: Diagnosis not present

## 2018-06-22 DIAGNOSIS — F909 Attention-deficit hyperactivity disorder, unspecified type: Secondary | ICD-10-CM | POA: Diagnosis not present

## 2018-08-03 DIAGNOSIS — F909 Attention-deficit hyperactivity disorder, unspecified type: Secondary | ICD-10-CM | POA: Diagnosis not present

## 2018-08-03 DIAGNOSIS — Z79899 Other long term (current) drug therapy: Secondary | ICD-10-CM | POA: Diagnosis not present

## 2018-09-14 DIAGNOSIS — F909 Attention-deficit hyperactivity disorder, unspecified type: Secondary | ICD-10-CM | POA: Diagnosis not present

## 2018-11-12 DIAGNOSIS — F909 Attention-deficit hyperactivity disorder, unspecified type: Secondary | ICD-10-CM | POA: Diagnosis not present

## 2018-12-09 DIAGNOSIS — R0789 Other chest pain: Secondary | ICD-10-CM | POA: Diagnosis not present

## 2018-12-09 DIAGNOSIS — Z5320 Procedure and treatment not carried out because of patient's decision for unspecified reasons: Secondary | ICD-10-CM | POA: Diagnosis not present

## 2018-12-09 DIAGNOSIS — Z3A00 Weeks of gestation of pregnancy not specified: Secondary | ICD-10-CM | POA: Diagnosis not present

## 2018-12-09 DIAGNOSIS — Z3201 Encounter for pregnancy test, result positive: Secondary | ICD-10-CM | POA: Diagnosis not present

## 2018-12-09 DIAGNOSIS — R072 Precordial pain: Secondary | ICD-10-CM | POA: Diagnosis not present

## 2018-12-09 DIAGNOSIS — O9989 Other specified diseases and conditions complicating pregnancy, childbirth and the puerperium: Secondary | ICD-10-CM | POA: Diagnosis not present

## 2018-12-11 DIAGNOSIS — R079 Chest pain, unspecified: Secondary | ICD-10-CM | POA: Diagnosis not present

## 2018-12-11 DIAGNOSIS — O9989 Other specified diseases and conditions complicating pregnancy, childbirth and the puerperium: Secondary | ICD-10-CM | POA: Diagnosis not present

## 2018-12-11 DIAGNOSIS — R0789 Other chest pain: Secondary | ICD-10-CM | POA: Diagnosis not present

## 2018-12-11 DIAGNOSIS — Z3A00 Weeks of gestation of pregnancy not specified: Secondary | ICD-10-CM | POA: Diagnosis not present

## 2018-12-12 DIAGNOSIS — R079 Chest pain, unspecified: Secondary | ICD-10-CM | POA: Diagnosis not present

## 2019-01-02 DIAGNOSIS — Z6838 Body mass index (BMI) 38.0-38.9, adult: Secondary | ICD-10-CM | POA: Diagnosis not present

## 2019-01-02 DIAGNOSIS — R7303 Prediabetes: Secondary | ICD-10-CM | POA: Diagnosis not present

## 2019-01-02 DIAGNOSIS — F319 Bipolar disorder, unspecified: Secondary | ICD-10-CM | POA: Diagnosis not present

## 2019-01-02 DIAGNOSIS — R632 Polyphagia: Secondary | ICD-10-CM | POA: Diagnosis not present

## 2019-01-04 DIAGNOSIS — F909 Attention-deficit hyperactivity disorder, unspecified type: Secondary | ICD-10-CM | POA: Diagnosis not present

## 2019-01-07 DIAGNOSIS — F909 Attention-deficit hyperactivity disorder, unspecified type: Secondary | ICD-10-CM | POA: Diagnosis not present

## 2019-01-08 DIAGNOSIS — F319 Bipolar disorder, unspecified: Secondary | ICD-10-CM | POA: Diagnosis not present

## 2019-01-08 DIAGNOSIS — R7303 Prediabetes: Secondary | ICD-10-CM | POA: Diagnosis not present

## 2019-01-08 DIAGNOSIS — Z6838 Body mass index (BMI) 38.0-38.9, adult: Secondary | ICD-10-CM | POA: Diagnosis not present

## 2019-01-24 DIAGNOSIS — R7303 Prediabetes: Secondary | ICD-10-CM | POA: Diagnosis not present

## 2019-01-24 DIAGNOSIS — Z6838 Body mass index (BMI) 38.0-38.9, adult: Secondary | ICD-10-CM | POA: Diagnosis not present

## 2019-01-24 DIAGNOSIS — F3130 Bipolar disorder, current episode depressed, mild or moderate severity, unspecified: Secondary | ICD-10-CM | POA: Diagnosis not present

## 2019-01-28 DIAGNOSIS — Z713 Dietary counseling and surveillance: Secondary | ICD-10-CM | POA: Diagnosis not present

## 2019-02-05 DIAGNOSIS — F3130 Bipolar disorder, current episode depressed, mild or moderate severity, unspecified: Secondary | ICD-10-CM | POA: Diagnosis not present

## 2019-02-05 DIAGNOSIS — Z6836 Body mass index (BMI) 36.0-36.9, adult: Secondary | ICD-10-CM | POA: Diagnosis not present

## 2019-02-05 DIAGNOSIS — F5081 Binge eating disorder: Secondary | ICD-10-CM | POA: Diagnosis not present

## 2019-02-05 DIAGNOSIS — R7303 Prediabetes: Secondary | ICD-10-CM | POA: Diagnosis not present

## 2019-03-01 DIAGNOSIS — Z6836 Body mass index (BMI) 36.0-36.9, adult: Secondary | ICD-10-CM | POA: Diagnosis not present

## 2019-03-01 DIAGNOSIS — E669 Obesity, unspecified: Secondary | ICD-10-CM | POA: Diagnosis not present

## 2019-03-01 DIAGNOSIS — Z713 Dietary counseling and surveillance: Secondary | ICD-10-CM | POA: Diagnosis not present

## 2019-03-06 DIAGNOSIS — F909 Attention-deficit hyperactivity disorder, unspecified type: Secondary | ICD-10-CM | POA: Diagnosis not present

## 2019-03-18 DIAGNOSIS — E669 Obesity, unspecified: Secondary | ICD-10-CM | POA: Diagnosis not present

## 2019-03-18 DIAGNOSIS — F5081 Binge eating disorder: Secondary | ICD-10-CM | POA: Diagnosis not present

## 2019-03-18 DIAGNOSIS — R7303 Prediabetes: Secondary | ICD-10-CM | POA: Diagnosis not present

## 2019-05-08 DIAGNOSIS — R5383 Other fatigue: Secondary | ICD-10-CM | POA: Diagnosis not present

## 2019-05-08 DIAGNOSIS — R935 Abnormal findings on diagnostic imaging of other abdominal regions, including retroperitoneum: Secondary | ICD-10-CM | POA: Diagnosis not present

## 2019-05-08 DIAGNOSIS — K51311 Ulcerative (chronic) rectosigmoiditis with rectal bleeding: Secondary | ICD-10-CM | POA: Diagnosis not present

## 2019-05-08 DIAGNOSIS — R1030 Lower abdominal pain, unspecified: Secondary | ICD-10-CM | POA: Diagnosis not present

## 2019-05-08 DIAGNOSIS — K8020 Calculus of gallbladder without cholecystitis without obstruction: Secondary | ICD-10-CM | POA: Diagnosis not present

## 2019-05-24 DIAGNOSIS — Z6835 Body mass index (BMI) 35.0-35.9, adult: Secondary | ICD-10-CM | POA: Diagnosis not present

## 2019-05-24 DIAGNOSIS — R7303 Prediabetes: Secondary | ICD-10-CM | POA: Diagnosis not present

## 2019-05-24 DIAGNOSIS — F5081 Binge eating disorder: Secondary | ICD-10-CM | POA: Diagnosis not present

## 2019-07-16 DIAGNOSIS — R7303 Prediabetes: Secondary | ICD-10-CM | POA: Diagnosis not present

## 2019-07-16 DIAGNOSIS — Z6836 Body mass index (BMI) 36.0-36.9, adult: Secondary | ICD-10-CM | POA: Diagnosis not present

## 2019-07-16 DIAGNOSIS — F5081 Binge eating disorder: Secondary | ICD-10-CM | POA: Diagnosis not present

## 2019-07-16 NOTE — Progress Notes (Addendum)
Patient ID: Annette Snyder, female    DOB: Mar 08, 1981, 39 y.o.   MRN: [id number]  PCP: Towanda Malkin, MD  Chief Complaint  Patient presents with  . New Patient (Initial Visit)  . Establish Care  . Back Pain    was going to [place], back pain has been ongoing for 2 years, worse in the last 6 months  . Nicotine Dependence    patinet would like to stop smoking    Subjective:   Annette Snyder is a 39 y.o. female, presents to clinic with CC of the following:  Chief Complaint  Patient presents with  . New Patient (Initial Visit)  . Establish Care  . Back Pain    was going to [place], back pain has been ongoing for 2 years, worse in the last 6 months  . Nicotine Dependence    patinet would like to stop smoking    HPI:  Patient is a 39 year old female who presents today new to the practice. Her main complaint today is back pain, and a desire to quit smoking. She has been followed at the [facility] system for a lot of her previous care.  She has recently been followed by bariatrics at the weight management center for excess body weight and associated risk factors/comorbidities, with her last MD visit 07/16/2019 Patient with recent consideration of bariatric surgery, but with current BMI 35.9, patient is not acandidate.  She noted she is currently attempting to lose more weight. Wt Readings from Last 4 Encounters:  07/16/19 87.1 kg (192 lb 1.6 oz)  07/16/19 87 kg (191 lb 12.8 oz)  06/20/19 86.2 kg (190 lb)  05/24/19 86.3 kg (190 lb 3.2 oz)   Other problems being monitored/treated included:  Prediabetes: Currently untreated with medications. Initial hemoglobin A1c 5.7%.Followed at [facility] presently  Bipolar depression: Currently treated by psychiatrist at [facility],  Current medications - Lexapro, Buspar, and abilify presently), gets her Abilify shot monthly.  She notes meds helpful  Binge eating and ADHD: prior treatment with Vyvanse. Discontinued previously due to outside provider's lack  of ability to prescribe controlled substances. Currently was taking Vyvanse 57m daily. [facility] and was increased to 50 mg again after yesterday's visit there.  I did review the controlled nature of this medicine, and the need for her to continue to have those providers write the prescription for her and provide as I cannot do so.   Anemia history: 05/08/2019 - hgb 11.7 with low red blood cell indices, prior CBCs from 2018 and prior reviewed, noted she was more anemic over that time  Iron panel done 05/08/19 -had a low iron level, low ferritin, low iron saturation - 26, 6.5 and 6 respectively, vitamin B12 level was normal Just saw hematologist a week ago, [facility] constip[facility] and not good for her UC so gets infusions  Ulcerative colitis  Dx'ed with scope, 2017, most recent scope in Feb, [facility], also Linzess Does have occasional bloody stools noted, some pains with those at times.  Thyroid nodule history -she [place].  [place] [facility] [place]  [facility] -[facility].  Saw one time, had an X-ray, told SI'iatis, was 3  weeks ago, had steroid dose pack, helped while on it, back pain now back.  She also got a shot in the buttock, she thinks was Toradol when noted that possibility.  She notes she has had low back pain for the last couple years, it has worsened in the past 6 months  Notes pain a 7-8 /10 on the pain scale at its worst Pain felt in lower back more and across the lower back in the belt-like pattern.  Not more one-sided. Some difficulty sleeping due to the pain, waking her up No acute trauma prior to onset Just had a baby in 2019, back painful during pregnacy  Some pain at rest and increased  with getting up, improves as up and about No pain radiates down the legs.  Occasionally feels it a little into the buttock. No numbness, tingling or marked weakness in LE's, no foot drop No bowel or bladder dysfunction symptoms, did note occas "accident" with urine if cant get to BR quick enough, no saddle anesthesia symptoms No fevers or other infectious symptoms of concern Has tried NSAID, ibuprofen and heat and ice topically to help, ice works better  Has a h/o low back pain in the past,  No h/o major prior back injury/fracture/surgery No cancer history No diabetes history, ? prediabetes  Tob dependence -patient is an everyday smoker since 2017, now 1/2 PPD, and desires to quit, tried to quit cold [place] twice in past without success.  Has not tried any nicotine supplements or other medicines to help  Alcohol use - denied  Stays alone at home with 4 kids. Mom helps, sister helps   Current Outpatient Medications:  .  ARIPiprazole ER (ABILIFY MAINTENA) 300 MG SRER injection, Inject 300 mg into the muscle every 28 (twenty-eight) days., Disp: , Rfl:  .  busPIRone (BUSPAR) 15 MG tablet, Take 15 mg by mouth 3 (three) times daily., Disp: , Rfl:  .  Cetirizine HCl 10 MG CAPS, Take by mouth., Disp: , Rfl:  .  escitalopram (LEXAPRO) 10 MG tablet, Take 10 mg by mouth daily., Disp: , Rfl:  .  lisdexamfetamine (VYVANSE) 20 MG capsule, Take 20 mg by mouth daily., Disp: , Rfl:    Allergies  Allergen Reactions  . Flagyl [Metronidazole] Rash  . Seroquel [Quetiapine] Rash     History reviewed. No pertinent surgical history.   Family History  Problem Relation Age of Onset  . Diabetes Mother   . Glaucoma Mother   . Hypertension Mother   . Blindness Mother   . Hypertension Father   . Stroke Father   . Bipolar disorder Brother   . Crohn's disease Brother   . Depression Maternal Grandmother   . Depression Maternal Grandfather   . Bipolar disorder Brother      Social History    Tobacco Use  . Smoking status: Current Every Day Smoker    Types: Cigarettes  . Smokeless tobacco: Never Used  Substance Use Topics  . Alcohol use: No    With staff assistance, above reviewed with the patient/caregiver today.  ROS: As per HPI, otherwise no specific complaints on a limited and focused system review   No results found for this or any previous visit (from the past 72 hour(s)).   PHQ2/9: Depression screen [facility] 2/9 07/17/2019  Decreased Interest 0  Down, Depressed, Hopeless 0  PHQ - 2 Score 0  Altered sleeping 3  Tired, decreased energy 1  Change in appetite 1  Feeling bad or failure about yourself  0  Trouble concentrating 1  Moving slowly or fidgety/restless 0  Suicidal thoughts 0  PHQ-9 Score 6  Difficult doing work/chores Somewhat difficult   PHQ-2/9 Result reviewed  GAD 7 : Generalized Anxiety Score 07/17/2019  Nervous, Anxious, on Edge 3  Control/stop worrying 1  Worry too much - different things 1  Trouble relaxing 3  Restless 3  Easily annoyed or irritable 3  Afraid - awful might happen 3  Total GAD 7 Score 17  Anxiety Difficulty Somewhat difficult   Result reviewed  Fall Risk: Fall Risk  07/17/2019  Falls in the past year? 0  Number falls in past yr: 0  Injury with Fall? 0      Objective:   Vitals:   07/17/19 0756  BP: 104/60  Pulse: 99  Resp: 16  Temp: 98.2 F (36.8 C)  TempSrc: Temporal  SpO2: 98%  Weight: 192 lb 4.8 oz (87.2 kg)  Height: 5' 1"  (1.549 m)    Body mass index is 36.33 kg/m.  Physical Exam   NAD, masked, pleasant HEENT - /AT, sclera anicteric, PERRL, EOMI, conj - non-inj'ed, TM's and canals clear, pharynx clear Neck - supple, no adenopathy, no TM, carotids 2+ and = without bruits bilat Car - RRR without m/g/r, not tachy on my exam Pulm- RR and effort normal at rest, CTA without wheeze or rales Abd - soft, obese, NT, ND, BS+,  no masses, no HSM Back - no CVA tenderness.    Hesitant with ROM,   Painful  with palpation in lower back, across the lower back at the mid to lower lumbar spine level, not focally tender over the lumbosacral spine, no spasm, no bruising or swelling No focal SI joint tenderness             SLR negative, with some pain in lower back noted with testing bilat, no pains down the back of the legs with the testing             Tightness more than any pain with bringing right and left knee to chest when supine  Good strength in LE's on testing including with dorsi and plantar flexion of feet bilateral             Sensation intact to LT in bilteral LE's distal             DTR's 2+ and = in patella and achilles Ext - no LE edema,  Neuro/psychiatric - affect was not flat, appropriate with conversation  Alert and oriented  Grossly non-focal - good strength on testing extremities, sensation intact to LT in distal extremities, Romberg negative, no pronator drift, good finger-to-nose, good balance on 1 foot easily, normal gait with good tandem walk  Speech  normal   Comp panel 05/08/2019 was entirely normal (with the only exception being a low anion gap at 4)    Assessment & Plan:   1. Chronic bilateral low back pain without sciatica See below. - Ambulatory referral to Physical Therapy - meloxicam (MOBIC) 15 MG tablet; Take 1 tablet (15 mg total) by mouth daily.  Dispense: 30 tablet; Refill: 2 - cyclobenzaprine (FLEXERIL) 5 MG tablet; Take 1 tablet (5 mg total) by mouth at bedtime. May increase to two tabs at bedtime pending response  Dispense: 60 tablet; Refill: 1  2. Acute bilateral low back pain without sciatica Educated, do feel there is likely a mechanical component and less likely a discogenic source without radicular signs or symptoms.   X-ray discussed -  noted just had one 3 weeks ago at the [place], although I have no record of that.  Patient communicated to me he was told had sacroiliitis.  Unclear if did lumbosacral films or just SI joint films.  Not feel rushing to further  imaging needed today, with no red flag symptoms of concern Contrast therapy reviewed with ice recommended with warm compresses, followed by gentle ROM (not ballistic and exercises reviewed), followed by ice after to help lessen inflammation,  Was tolerating the ibuprofen as needed, and noted the potential concerns with NSAIDs and her ulcerative colitis.  We will add a Mobic product-once daily, at least in the short-term, and not to take other anti-inflammatories like ibuprofen or Aleve products when taking the Mobic. Discussed a muscle relaxer, and she very much wanted to try that, Flexeril - 5 mg qhs prn prescribed, and warned may make drowsy and take predominantly at bedtime to help. Stay active also recommended and discussed physical therapy, and she wanted to proceed with physical therapy, which I felt was reasonable given the chronicity of back pain in her case, and this being more of an acute on chronic process  - Ambulatory referral to Physical Therapy - meloxicam (MOBIC) 15 MG tablet; Take 1 tablet (15 mg total) by mouth daily.  Dispense: 30 tablet; Refill: 2 - cyclobenzaprine (FLEXERIL) 5 MG tablet; Take 1 tablet (5 mg total) by mouth at bedtime. May increase to two tabs at bedtime pending response  Dispense: 60 tablet; Refill: 1  3. Tobacco dependence Discussed the nicotine supplements, also medicines that are sometimes added to help like Chantix.  She wanted to try the nicotine supplements initially before adding another systemic medicine, I felt that was reasonable.  Discussed the patches and gums, how they are over-the-counter now, and she will try the supplements initially.  Emphasized being committed to trying to stop as well as important in the process.  4. History of prediabetes Noted never labeled with the diabetes concern, and [facility] through [facility] clinic.  5. History of thyroid nodule Continue to monitor  presently.  Noted no recent concerns when followed up within the [place] system.  6. Bipolar depression ([place]) Continue to see psychiatry as she is doing.  7. Binge eating Continue to follow with the weight management clinic as planned.  8. Attention deficit hyperactivity disorder (ADHD), unspecified ADHD type Her Vyvanse was recently increased again yesterday to 50 mg, a fairly high dose.  She was made aware that she will need to continue to follow with her doctors who are prescribing this presently as I am uncomfortable refilling that and noted the controlled nature of that medication.  9. Other iron deficiency anemia She currently is receiving iron transfusions, as not tolerating the oral iron supplements due to constipation and issues with her ulcerative colitis.  Continue follow-up with hematology as she is doing presently.  10. Ulcerative colitis with rectal bleeding, unspecified location [facility]) Continue to follow with gastroenterology as she is doing.  11. Class 2 obesity due to excess calories without serious comorbidity with body mass index (BMI) of 36.0 to 36.9 in adult Continues to follow with the weight management clinic as well    We will have a follow-up in about 4 weeks time to assess how she is doing with her back symptoms, and any success with trying to quit tobacco which were strongly encouraged today, follow-up sooner as needed.  Towanda Malkin, MD 07/17/19 8:15 AM

## 2019-07-17 ENCOUNTER — Encounter: Payer: Self-pay | Admitting: Internal Medicine

## 2019-07-17 ENCOUNTER — Other Ambulatory Visit: Payer: Self-pay

## 2019-07-17 ENCOUNTER — Ambulatory Visit: Payer: Medicaid Other | Admitting: Internal Medicine

## 2019-07-17 VITALS — BP 104/60 | HR 99 | Temp 98.2°F | Resp 16 | Ht 61.0 in | Wt 192.3 lb

## 2019-07-17 DIAGNOSIS — M545 Low back pain, unspecified: Secondary | ICD-10-CM

## 2019-07-17 DIAGNOSIS — Z6836 Body mass index (BMI) 36.0-36.9, adult: Secondary | ICD-10-CM

## 2019-07-17 DIAGNOSIS — Z8639 Personal history of other endocrine, nutritional and metabolic disease: Secondary | ICD-10-CM | POA: Diagnosis not present

## 2019-07-17 DIAGNOSIS — F319 Bipolar disorder, unspecified: Secondary | ICD-10-CM | POA: Insufficient documentation

## 2019-07-17 DIAGNOSIS — F17200 Nicotine dependence, unspecified, uncomplicated: Secondary | ICD-10-CM | POA: Insufficient documentation

## 2019-07-17 DIAGNOSIS — D649 Anemia, unspecified: Secondary | ICD-10-CM | POA: Insufficient documentation

## 2019-07-17 DIAGNOSIS — R632 Polyphagia: Secondary | ICD-10-CM | POA: Insufficient documentation

## 2019-07-17 DIAGNOSIS — K51911 Ulcerative colitis, unspecified with rectal bleeding: Secondary | ICD-10-CM

## 2019-07-17 DIAGNOSIS — F909 Attention-deficit hyperactivity disorder, unspecified type: Secondary | ICD-10-CM

## 2019-07-17 DIAGNOSIS — D508 Other iron deficiency anemias: Secondary | ICD-10-CM

## 2019-07-17 DIAGNOSIS — G8929 Other chronic pain: Secondary | ICD-10-CM

## 2019-07-17 DIAGNOSIS — Z87898 Personal history of other specified conditions: Secondary | ICD-10-CM | POA: Diagnosis not present

## 2019-07-17 DIAGNOSIS — E6609 Other obesity due to excess calories: Secondary | ICD-10-CM | POA: Insufficient documentation

## 2019-07-17 MED ORDER — MELOXICAM 15 MG PO TABS: 15.0000 mg | ORAL_TABLET | Freq: Every day | ORAL | 2 refills | Status: DC

## 2019-07-17 MED ORDER — CYCLOBENZAPRINE HCL 5 MG PO TABS: 5.0000 mg | ORAL_TABLET | Freq: Every day | ORAL | 1 refills | Status: DC

## 2019-07-17 NOTE — Patient Instructions (Signed)

## 2019-07-22 ENCOUNTER — Other Ambulatory Visit: Payer: Self-pay

## 2019-07-22 ENCOUNTER — Ambulatory Visit: Payer: Medicaid Other | Attending: Internal Medicine | Admitting: Physical Therapy

## 2019-07-22 DIAGNOSIS — M6281 Muscle weakness (generalized): Secondary | ICD-10-CM | POA: Insufficient documentation

## 2019-07-22 DIAGNOSIS — M545 Low back pain, unspecified: Secondary | ICD-10-CM

## 2019-07-22 DIAGNOSIS — R293 Abnormal posture: Secondary | ICD-10-CM | POA: Diagnosis not present

## 2019-07-22 DIAGNOSIS — G8929 Other chronic pain: Secondary | ICD-10-CM | POA: Insufficient documentation

## 2019-07-22 NOTE — Therapy (Signed)
[facility] [address] [place], [place], [address] Phone: [phone]   Fax:  [phone]  Physical Therapy Evaluation  Patient Details  Name: Annette Snyder MRN: [id number] Date of Birth: 14-Jul-1980 Referring Provider (PT): Lebron Conners   Encounter Date: 07/22/2019  PT End of Session - 07/22/19 1103    Visit Number  1    Number of Visits  8    Date for PT Re-Evaluation  09/16/19    Authorization Type  CAID    Authorization - Visit Number  1    Authorization - Number of Visits  4    PT Start Time  1105    PT Stop Time  1200    PT Time Calculation (min)  55 min    Activity Tolerance  Patient tolerated treatment well;Patient limited by pain    Behavior During Therapy  [facility] for tasks assessed/performed       Past Medical History:  Diagnosis Date  . Anxiety   . Bipolar disorder ([place])   . Chronic back pain   . Colitis   . PTSD (post-traumatic stress disorder)     No past surgical history on file.  There were no vitals filed for this visit.       [facility] PT Assessment - 07/22/19 0001      Assessment   Medical Diagnosis  Chronic LBP without sciatica    Referring Provider (PT)  Lebron Conners    Onset Date/Surgical Date  07/21/17    Hand Dominance  Right    Next MD Visit  08/14/2019    Prior Therapy  Yes (for knee)      Balance Screen   Has the patient fallen in the past 6 months  No       [facility] PHYSICAL THERAPY EVALUATION  SCREENING Red Flags: None Have you had any night sweats? Unexplained weight loss? Saddle anesthesia? Unexplained changes in bowel or bladder habits?  Precautions: None  SUBJECTIVE  Chief Complaint: Patient reports that back pain started during prgenancy in 2019. Patient notes a 4 day labor during which she was predominantly on her back with occasional sidelying and presence of catheter. Patient reports being seen at [facility]; received xrays (not remarkable); was told  she had inflammation at her SIJ. Back pain is worse when sedentary and then stiff on changing positions. Patient reports that she can sit/static stand about 10 minutes before stiffness onset. Patient notes that she also has pain while grocery shopping, but since she is moving it works out a little bit.   Pertinent History:  Falls Negative.  Scoliosis Negative. Pulmonary disease/dysfunction Negative. Surgical history: Negative.   Recent Procedures/Tests/Findings: Xray; MRI  Obstetrical History: G4P4 Deliveries: all labored on back; vaginal deliveries Tearing/Episiotomy: none  Gynecological History: Hysterectomy: No  Urinary History: Incontinence: Positive. Onset: 2018 Triggers: coughing/laughing/sneezing; urgency; flatulence Amount: Min (SUI)/Complete Loss (UUI). Fluid Intake: 48-64 oz H20, 12-24 oz oz caffeinated, 12 oz juices/sodas Nocturia: 2x/night Frequency of urination: every 0.5-1 hours Pain with urination: Negative Difficulty initiating urination: Negative Frequent UTI: hx of several.    Location of pain: lower lumbar/SIJ Current pain: 4 /10  Max pain:  9 /10 Least pain: 2 /10 Pain quality: pain quality: sharp and shooting (max); tender/dull ache (current) Radiating pain: Yes to RLE (psoterior to the popliteal fossa)   Patient assessment of present state: Unsure  Current activities:  "I want to start exercising and stuff, but not doing that right now." Beauty/relaxation for  stress management  Patient Goals:  "feel a little stronger"; "more core strength"  Patient perception of overall health: [place] Status Patient is oriented to person, place and time.  Recent memory is intact.  Remote memory is intact.  Attention span and concentration are intact.  Expressive speech is intact.  Patient's fund of knowledge is within normal limits for educational level.  POSTURE/OBSERVATIONS:  Lumbar lordosis: increased Iliac crest height: equal  bilaterally Lumbar lateral shift: negative Pelvic obliquity: negative Leg length discrepancy: negative  GAIT: Trendelenburg R: Negative L: Negative  RANGE OF MOTION:    LEFT RIGHT  Lumbar forward flexion (65):  WNL    Lumbar extension (30): 13 *    Lumbar lateral flexion (25):  WNL* WNL*  Thoracic and Lumbar rotation (30 degrees):    WNL WNL  Hip Flexion (0-125):   WNL WNL  Hip IR (0-45):  WNL* WNL*  Hip ER (0-45):  WNL WNL  Hip Abduction (0-40):  WNL WNL  Hip extension (0-15):  WNL WNL   Innominate mobilizations reduced pain with repetition, posterior rotational force. Patient tender to CPA/UPA of L3-5.   SENSATION: Grossly intact to light touch bilateral LEs as determined by testing dermatomes L2-S2 Proprioception and hot/cold testing deferred on this date  STRENGTH: MMT   RLE LLE  Hip Flexion 4 5  Hip Extension 4 5  Hip Abduction  5 5  Hip Adduction  5 5  Hip ER  4 4  Hip IR  4 4  Knee Extension 5 5  Knee Flexion 5 5  Dorsiflexion  5 5  Plantarflexion (seated) 5 5   ABDOMINAL:  Palpation: TTP at B LQ in the region of the psoas Diastasis: none noted  SPECIAL TESTS: Slump (SN 83, -LR 0.32): R: Negative L: Negative SLR (SN 92, -LR 0.29): R: Negative L:  Negative Lumbar quadrant (SN 70): R: Negative L: Negative FABER (SN 81): R: Negative L: Negative FADIR (SN 94): R: Positive L: Positive Compression (SN/SP 69): R: Negative L: Negative  PHYSICAL PERFORMANCE MEASURES: STS: WFL  EXTERNAL PELVIC EXAM: Deferred 2/2 to time constraints/therapeutic alliance development Palpation: Breath coordination: Cued Lengthen: Cued Contraction: Cough:   OUTCOME MEASURES: FOTO (63)   ASSESSMENT Patient is a 39 year old presenting to clinic with chief complaints of lower lumbar and sacral pain with prolonged positioning. Upon examination, patient demonstrates deficits in RLE strength, posture, B hip IR pain free motion, pain, lumbar extension AROM, mixed UI as evidenced  by gross 4/5 RLE MMT, increased lordosis in standing, pain with B hip IR PROM, lumbar extension of 13 degrees and painful, and incontinence with laughing/coughing/sneezing and with urgency. Patient's responses on FOTO outcome measures (63) indicate moderate functional limitations/disability/distress. Patient's progress may be limited due to persistence of pain and comorbidities; however, patient's motivation is advantageous. Patient was able to achieve mild pain relief with innominate posterior mobilizations during today's evaluation and responded positively to educational interventions. Patient will benefit from continued skilled therapeutic intervention to address deficits in RLE strength, posture, B hip IR pain free motion, pain, lumbar extension AROM in order to increase function and improve overall QOL.  EDUCATION Patient educated on prognosis, POC, and provided with HEP including: piriformis stretch and posterior pelvic tilts. Patient articulated understanding and returned demonstration. Patient will benefit from further education in order to maximize compliance and understanding for long-term therapeutic gains.  TREATMENT Neuromuscular Re-education: Patient educated on sleep positions and log roll technique for decreased irritability of back  pain.  Objective measurements completed on examination: See above findings.        PT Long Term Goals - 07/22/19 1250      PT LONG TERM GOAL #1   Title  Patient will demonstrate independence with HEP in order to maximize therapeutic gains and improve carryover from physical therapy sessions to ADLs in the home and community.    Baseline  IE: provided    Time  8    Period  Weeks    Status  New    Target Date  09/16/19      PT LONG TERM GOAL #2   Title  Patient will demonstrate improved function as evidenced by a score of 67 on FOTO measure for full participation in activities at home and in the community.    Baseline  IE: 31    Time  8     Period  Weeks    Status  New    Target Date  09/16/19      PT LONG TERM GOAL #3   Title  Patient will decrease worst pain as reported on NPRS by at least 2 points to demonstrate clinically significant reduction in pain in order to restore/improve function and overall QOL.    Baseline  IE: 9/10    Time  8    Period  Weeks    Status  New    Target Date  09/16/19      PT LONG TERM GOAL #4   Title  Patient will demonstrate pain limited ROM for lumbar extension and B hip IR in order to participate fully in caregiving activities.    Baseline  IE: all painful    Time  8    Period  Weeks    Status  New    Target Date  09/16/19      PT LONG TERM GOAL #5   Title  Patient will improve RLE strength by 1/2 MMT grade in order to demonstrate clinically significant improvement and return to full participation in activities at home and in the community.    Baseline  IE: 4/5    Time  8    Period  Weeks    Status  New    Target Date  09/16/19             Plan - 07/22/19 1101    Clinical Impression Statement  Patient is a 39 year old presenting to clinic with chief complaints of lower lumbar and sacral pain with prolonged positioning. Upon examination, patient demonstrates deficits in RLE strength, posture, B hip IR pain free motion, pain, lumbar extension AROM as evidenced by gross 4/5 RLE MMT, increased lordosis in standing, pain with B hip IR PROM, lumbar extension of 13 degrees and painful. Patient's responses on FOTO outcome measures (63) indicate moderate functional limitations/disability/distress. Patient's progress may be limited due to persistence of pain and comorbidities; however, patient's motivation is advantageous. Patient was able to achieve mild pain relief with innominate posterior mobilizations during today's evaluation and responded positively to educational interventions. Patient will benefit from continued skilled therapeutic intervention to address deficits in RLE strength,  posture, B hip IR pain free motion, pain, lumbar extension AROM in order to increase function and improve overall QOL.    Personal Factors and Comorbidities  Comorbidity 3+;Past/Current Experience;Time since onset of injury/illness/exacerbation;Sex;Age;Education;Behavior Pattern;Finances    Comorbidities  bipolar depression, ADHD, anxiety, PTSD, colitis    Examination-Activity Limitations  Transfers;Bathing;Dressing;Bed Mobility;Lift;Squat;Sleep;Bend;Stairs;Stand;Reach Overhead    Examination-Participation Restrictions  Personal Finances;Interpersonal Relationship;Yard Work;Laundry;Cleaning;Community Activity    Stability/Clinical Decision Making  Evolving/Moderate complexity    Clinical Decision Making  Moderate    Rehab Potential  Fair    PT Frequency  2x / week    PT Duration  8 weeks    PT Treatment/Interventions  Cryotherapy;Moist Heat;Electrical Stimulation;Therapeutic activities;Functional mobility training;Stair training;Gait training;Therapeutic exercise;Balance training;Neuromuscular re-education;Patient/family education;Manual techniques;Taping;Splinting;Dry needling;Passive range of motion;Spinal Manipulations;Joint Manipulations;Scar mobilization    PT Next Visit Plan  manual PRN, postural re-education    PT Home Exercise Plan  Access Code: [email or web address]    Consulted and Agree with Plan of Care  Patient       Patient will benefit from skilled therapeutic intervention in order to improve the following deficits and impairments:  Abnormal gait, Decreased endurance, Decreased mobility, Difficulty walking, Increased muscle spasms, Improper body mechanics, Postural dysfunction, Pain, Decreased strength, Decreased coordination, Decreased activity tolerance, Decreased range of motion  Visit Diagnosis: Chronic low back pain without sciatica, unspecified back pain laterality  Muscle weakness (generalized)  Abnormal posture     Problem List Patient Active Problem List   Diagnosis Date  Noted  . Acute bilateral low back pain without sciatica 07/17/2019  . Chronic bilateral low back pain without sciatica 07/17/2019  . Tobacco dependence 07/17/2019  . History of thyroid nodule 07/17/2019  . Bipolar depression ([place]) 07/17/2019  . Binge eating 07/17/2019  . Attention deficit hyperactivity disorder (ADHD) 07/17/2019  . Absolute anemia 07/17/2019  . Ulcerative colitis with rectal bleeding ([place]) 07/17/2019  . Class 2 obesity due to excess calories without serious comorbidity with body mass index (BMI) of 36.0 to 36.9 in adult 07/17/2019   Myles Gip PT, DPT [phone]  07/22/2019, 12:52 PM  [facility] [address] [place], [place], [address] Phone: [phone]   Fax:  [phone]  Name: TIWATOPE EMMITT MRN: [id number] Date of Birth: 09/12/1980

## 2019-07-26 DIAGNOSIS — M25561 Pain in right knee: Secondary | ICD-10-CM | POA: Diagnosis not present

## 2019-07-26 DIAGNOSIS — G8929 Other chronic pain: Secondary | ICD-10-CM | POA: Diagnosis not present

## 2019-07-30 ENCOUNTER — Other Ambulatory Visit: Payer: Self-pay

## 2019-07-30 ENCOUNTER — Ambulatory Visit: Payer: Medicaid Other | Admitting: Physical Therapy

## 2019-07-30 DIAGNOSIS — M545 Low back pain, unspecified: Secondary | ICD-10-CM

## 2019-07-30 DIAGNOSIS — M6281 Muscle weakness (generalized): Secondary | ICD-10-CM | POA: Diagnosis not present

## 2019-07-30 DIAGNOSIS — G8929 Other chronic pain: Secondary | ICD-10-CM

## 2019-07-30 DIAGNOSIS — R293 Abnormal posture: Secondary | ICD-10-CM | POA: Diagnosis not present

## 2019-07-30 NOTE — Therapy (Signed)
[facility] [address] [place], [place], [address] Phone: [phone]   Fax:  [phone]  Physical Therapy Treatment  Patient Details  Name: Annette Snyder MRN: [id number] Date of Birth: 1980/10/14 Referring Provider (PT): Lebron Conners   Encounter Date: 07/30/2019  PT End of Session - 07/30/19 1049    Visit Number  2    Number of Visits  8    Date for PT Re-Evaluation  09/16/19    Authorization Type  CAID    Authorization - Visit Number  2    Authorization - Number of Visits  4    PT Start Time  1050    PT Stop Time  1145    PT Time Calculation (min)  55 min    Activity Tolerance  Patient tolerated treatment well    Behavior During Therapy  [facility] for tasks assessed/performed       Past Medical History:  Diagnosis Date  . Anxiety   . Bipolar disorder ([place])   . Chronic back pain   . Colitis   . PTSD (post-traumatic stress disorder)     No past surgical history on file.  There were no vitals filed for this visit.  Subjective Assessment - 07/30/19 1049    Subjective  Patient presents to clinic with continued pain in low back and flare up of R knee pain. Patient notes that both knees hurt, but R>L. She has pain and stiffness with increased swelling after use. Patient notes increased pain walking down an incline/steps greater than walking up.    Currently in Pain?  Yes    Pain Score  5     Pain Location  Back    Pain Orientation  Lower    Multiple Pain Sites  Yes    Pain Score  5    Pain Location  Knee    Pain Orientation  Right      TREATMENT  Manual Therapy: STM and TPR performed to B lumbar mm and R quad to allow for decreased tension and pain and improved posture and function with vibratory and percussive instrument R Patellar mobilizations for decreased spasm and improved mobility, grade II/III  Neuromuscular Re-education: Controlled articular rotations of R knee for improved joint mobility and  proprioception, 30% MVC, , x6 each direction Patient education on knee anatomy, relationship between posture and hip flexor tension.    Patient educated throughout session on appropriate technique and form using multi-modal cueing, HEP, and activity modification. Patient articulated understanding and returned demonstration.  Patient Response to interventions: 3/10 pain at end of session  ASSESSMENT Patient presents to clinic with excellent motivation to participate in therapy. Patient demonstrates deficits in RLE strength, posture, B hip IR pain free motion, pain, lumbar extension AROM, mixed UI. Patient able to achieve significantly decreased pain at R knee and lumbar region during today's session and responded positively to manual and neuromuscular interventions. Patient will benefit from continued skilled therapeutic intervention to address remaining deficits in RLE strength, posture, B hip IR pain free motion, pain, lumbar extension AROM, mixed UI in order to increase function and improve overall QOL.   PT Long Term Goals - 07/22/19 1250      PT LONG TERM GOAL #1   Title  Patient will demonstrate independence with HEP in order to maximize therapeutic gains and improve carryover from physical therapy sessions to ADLs in the home and community.    Baseline  IE: provided    Time  8    Period  Weeks    Status  New    Target Date  09/16/19      PT LONG TERM GOAL #2   Title  Patient will demonstrate improved function as evidenced by a score of 67 on FOTO measure for full participation in activities at home and in the community.    Baseline  IE: 17    Time  8    Period  Weeks    Status  New    Target Date  09/16/19      PT LONG TERM GOAL #3   Title  Patient will decrease worst pain as reported on NPRS by at least 2 points to demonstrate clinically significant reduction in pain in order to restore/improve function and overall QOL.    Baseline  IE: 9/10    Time  8    Period  Weeks     Status  New    Target Date  09/16/19      PT LONG TERM GOAL #4   Title  Patient will demonstrate pain limited ROM for lumbar extension and B hip IR in order to participate fully in caregiving activities.    Baseline  IE: all painful    Time  8    Period  Weeks    Status  New    Target Date  09/16/19      PT LONG TERM GOAL #5   Title  Patient will improve RLE strength by 1/2 MMT grade in order to demonstrate clinically significant improvement and return to full participation in activities at home and in the community.    Baseline  IE: 4/5    Time  8    Period  Weeks    Status  New    Target Date  09/16/19            Plan - 07/30/19 1049    Clinical Impression Statement  Patient presents to clinic with excellent motivation to participate in therapy. Patient demonstrates deficits in RLE strength, posture, B hip IR pain free motion, pain, lumbar extension AROM, mixed UI. Patient able to achieve significantly decreased pain at R knee and lumbar region during today's session and responded positively to manual and neuromuscular interventions. Patient will benefit from continued skilled therapeutic intervention to address remaining deficits in RLE strength, posture, B hip IR pain free motion, pain, lumbar extension AROM, mixed UI in order to increase function and improve overall QOL.    Personal Factors and Comorbidities  Comorbidity 3+;Past/Current Experience;Time since onset of injury/illness/exacerbation;Sex;Age;Education;Behavior Pattern;Finances    Comorbidities  bipolar depression, ADHD, anxiety, PTSD, colitis    Examination-Activity Limitations  Transfers;Bathing;Dressing;Bed Mobility;Lift;Squat;Sleep;Bend;Stairs;Stand;Reach Overhead    Examination-Participation Restrictions  Personal Finances;Interpersonal Relationship;Yard Work;Laundry;Cleaning;Community Activity    Stability/Clinical Decision Making  Evolving/Moderate complexity    Rehab Potential  Fair    PT Frequency  2x / week     PT Duration  8 weeks    PT Treatment/Interventions  Cryotherapy;Moist Heat;Electrical Stimulation;Therapeutic activities;Functional mobility training;Stair training;Gait training;Therapeutic exercise;Balance training;Neuromuscular re-education;Patient/family education;Manual techniques;Taping;Splinting;Dry needling;Passive range of motion;Spinal Manipulations;Joint Manipulations;Scar mobilization    PT Next Visit Plan  manual PRN, postural re-education    PT Home Exercise Plan  Access Code: [email or web address]    Consulted and Agree with Plan of Care  Patient       Patient will benefit from skilled therapeutic intervention in order to improve the following deficits and impairments:  Abnormal gait, Decreased endurance, Decreased mobility, Difficulty walking, Increased muscle spasms,  Improper body mechanics, Postural dysfunction, Pain, Decreased strength, Decreased coordination, Decreased activity tolerance, Decreased range of motion  Visit Diagnosis: Chronic low back pain without sciatica, unspecified back pain laterality  Muscle weakness (generalized)  Abnormal posture     Problem List Patient Active Problem List   Diagnosis Date Noted  . Acute bilateral low back pain without sciatica 07/17/2019  . Chronic bilateral low back pain without sciatica 07/17/2019  . Tobacco dependence 07/17/2019  . History of thyroid nodule 07/17/2019  . Bipolar depression ([place]) 07/17/2019  . Binge eating 07/17/2019  . Attention deficit hyperactivity disorder (ADHD) 07/17/2019  . Absolute anemia 07/17/2019  . Ulcerative colitis with rectal bleeding ([place]) 07/17/2019  . Class 2 obesity due to excess calories without serious comorbidity with body mass index (BMI) of 36.0 to 36.9 in adult 07/17/2019   Myles Gip PT, DPT [phone] 07/30/2019, 11:51 AM  [facility] [address] [place], [place], [address] Phone: [phone]   Fax:  [phone]  Name: Annette Snyder MRN: [id number] Date of Birth: [birth year]

## 2019-08-01 ENCOUNTER — Other Ambulatory Visit: Payer: Self-pay

## 2019-08-01 ENCOUNTER — Ambulatory Visit: Payer: Medicaid Other | Admitting: Physical Therapy

## 2019-08-01 ENCOUNTER — Encounter: Payer: Self-pay | Admitting: Physical Therapy

## 2019-08-01 DIAGNOSIS — M545 Low back pain, unspecified: Secondary | ICD-10-CM

## 2019-08-01 DIAGNOSIS — R293 Abnormal posture: Secondary | ICD-10-CM | POA: Diagnosis not present

## 2019-08-01 DIAGNOSIS — M6281 Muscle weakness (generalized): Secondary | ICD-10-CM | POA: Diagnosis not present

## 2019-08-01 DIAGNOSIS — G8929 Other chronic pain: Secondary | ICD-10-CM | POA: Diagnosis not present

## 2019-08-01 NOTE — Therapy (Signed)
[facility] [address] [place], [place], [address] Phone: [phone]   Fax:  [phone]  Physical Therapy Treatment  Patient Details  Name: Annette Snyder MRN: [id number] Date of Birth: 05/04/1980 Referring Provider (PT): Lebron Conners   Encounter Date: 08/01/2019  PT End of Session - 08/01/19 1059    Visit Number  3    Number of Visits  16    Date for PT Re-Evaluation  09/16/19    Authorization Type  CAID    Authorization - Visit Number  3    Authorization - Number of Visits  4    PT Start Time  1055    PT Stop Time  1150    PT Time Calculation (min)  55 min    Activity Tolerance  Patient tolerated treatment well    Behavior During Therapy  [facility] for tasks assessed/performed       Past Medical History:  Diagnosis Date  . Anxiety   . Bipolar disorder ([place])   . Chronic back pain   . Colitis   . PTSD (post-traumatic stress disorder)     History reviewed. No pertinent surgical history.  There were no vitals filed for this visit.  Subjective Assessment - 08/01/19 1057    Subjective  Patient notes that her back is feeling really tight. She notes that yesterdya her knee was bothering her but the knee CARs helped to calm it down a little.    Currently in Pain?  Yes    Pain Score  7     Pain Location  Back       TREATMENT  Manual Therapy: STM and TPR performed to B lumbar mm and R quad to allow for decreased tension and pain and improved posture and function with vibratory and percussive instrument  Neuromuscular Re-education: Supine hooklying diaphragmatic breathing with VCs and TCs for downregulation of the nervous system and improved management of IAP Supine hooklying trunk rotations. VCs and TCs to decrease compensatory patterns and encourage optimal relaxation Supine hooklying, TrA activation with exhalation. VCs and TCs to decrease compensatory patterns and minimize aggravation of the lumbar  paraspinals. Supine modified Thomas stretch for decreased anterior pelvic tilt and postural awareness, B Standing R hip flexor stretch with TrA activation and coordinated breath. VCs and TCs to decrease compensatory patterns.    Patient educated throughout session on appropriate technique and form using multi-modal cueing, HEP, and activity modification. Patient articulated understanding and returned demonstration.  Patient Response to interventions: 2-3/10 pain at end of session  ASSESSMENT Patient presents to clinic with excellent motivation to participate in therapy. Patient demonstrates deficits in RLE strength, posture, B hip IR pain free motion, pain, lumbar extension AROM, mixed UI. Patient able to achieve significantly decreased pain in lumbar region and perform abdominal rehabilitation phase 1 with good form during today's session and responded positively to manual and neuromuscular interventions. Patient will benefit from continued skilled therapeutic intervention to address remaining deficits in RLE strength, posture, B hip IR pain free motion, pain, lumbar extension AROM, mixed UI in order to increase function and improve overall QOL.    PT Long Term Goals - 07/22/19 1250      PT LONG TERM GOAL #1   Title  Patient will demonstrate independence with HEP in order to maximize therapeutic gains and improve carryover from physical therapy sessions to ADLs in the home and community.    Baseline  IE: provided    Time  8  Period  Weeks    Status  New    Target Date  09/16/19      PT LONG TERM GOAL #2   Title  Patient will demonstrate improved function as evidenced by a score of 67 on FOTO measure for full participation in activities at home and in the community.    Baseline  IE: 74    Time  8    Period  Weeks    Status  New    Target Date  09/16/19      PT LONG TERM GOAL #3   Title  Patient will decrease worst pain as reported on NPRS by at least 2 points to demonstrate  clinically significant reduction in pain in order to restore/improve function and overall QOL.    Baseline  IE: 9/10    Time  8    Period  Weeks    Status  New    Target Date  09/16/19      PT LONG TERM GOAL #4   Title  Patient will demonstrate pain limited ROM for lumbar extension and B hip IR in order to participate fully in caregiving activities.    Baseline  IE: all painful    Time  8    Period  Weeks    Status  New    Target Date  09/16/19      PT LONG TERM GOAL #5   Title  Patient will improve RLE strength by 1/2 MMT grade in order to demonstrate clinically significant improvement and return to full participation in activities at home and in the community.    Baseline  IE: 4/5    Time  8    Period  Weeks    Status  New    Target Date  09/16/19            Plan - 08/01/19 1100    Clinical Impression Statement  Patient presents to clinic with excellent motivation to participate in therapy. Patient demonstrates deficits in RLE strength, posture, B hip IR pain free motion, pain, lumbar extension AROM, mixed UI. Patient able to achieve significantly decreased pain in lumbar region and perform abdominal rehabilitation phase 1 with good form during today's session and responded positively to manual and neuromuscular interventions. Patient will benefit from continued skilled therapeutic intervention to address remaining deficits in RLE strength, posture, B hip IR pain free motion, pain, lumbar extension AROM, mixed UI in order to increase function and improve overall QOL.    Personal Factors and Comorbidities  Comorbidity 3+;Past/Current Experience;Time since onset of injury/illness/exacerbation;Sex;Age;Education;Behavior Pattern;Finances    Comorbidities  bipolar depression, ADHD, anxiety, PTSD, colitis    Examination-Activity Limitations  Transfers;Bathing;Dressing;Bed Mobility;Lift;Squat;Sleep;Bend;Stairs;Stand;Reach Overhead    Examination-Participation Restrictions  Personal  Finances;Interpersonal Relationship;Yard Work;Laundry;Cleaning;Community Activity    Stability/Clinical Decision Making  Evolving/Moderate complexity    Rehab Potential  Fair    PT Frequency  2x / week    PT Duration  8 weeks    PT Treatment/Interventions  Cryotherapy;Moist Heat;Electrical Stimulation;Therapeutic activities;Functional mobility training;Stair training;Gait training;Therapeutic exercise;Balance training;Neuromuscular re-education;Patient/family education;Manual techniques;Taping;Splinting;Dry needling;Passive range of motion;Spinal Manipulations;Joint Manipulations;Scar mobilization    PT Next Visit Plan  manual PRN, postural re-education    PT Home Exercise Plan  Access Code: [email or web address]    Consulted and Agree with Plan of Care  Patient       Patient will benefit from skilled therapeutic intervention in order to improve the following deficits and impairments:  Abnormal gait, Decreased endurance, Decreased mobility, Difficulty walking, Increased  muscle spasms, Improper body mechanics, Postural dysfunction, Pain, Decreased strength, Decreased coordination, Decreased activity tolerance, Decreased range of motion  Visit Diagnosis: Chronic low back pain without sciatica, unspecified back pain laterality  Muscle weakness (generalized)  Abnormal posture     Problem List Patient Active Problem List   Diagnosis Date Noted  . Acute bilateral low back pain without sciatica 07/17/2019  . Chronic bilateral low back pain without sciatica 07/17/2019  . Tobacco dependence 07/17/2019  . History of thyroid nodule 07/17/2019  . Bipolar depression ([place]) 07/17/2019  . Binge eating 07/17/2019  . Attention deficit hyperactivity disorder (ADHD) 07/17/2019  . Absolute anemia 07/17/2019  . Ulcerative colitis with rectal bleeding ([place]) 07/17/2019  . Class 2 obesity due to excess calories without serious comorbidity with body mass index (BMI) of 36.0 to 36.9 in adult 07/17/2019   Myles Gip  PT, DPT [phone] 08/01/2019, 12:10 PM  [place] [facility] [address] [place], [place], [address] Phone: [phone]   Fax:  [phone]  Name: Annette Snyder MRN: [id number] Date of Birth: 1980/05/15

## 2019-08-06 ENCOUNTER — Ambulatory Visit: Payer: Medicaid Other | Admitting: Physical Therapy

## 2019-08-06 ENCOUNTER — Encounter: Payer: Self-pay | Admitting: Physical Therapy

## 2019-08-06 ENCOUNTER — Other Ambulatory Visit: Payer: Self-pay

## 2019-08-06 DIAGNOSIS — M6281 Muscle weakness (generalized): Secondary | ICD-10-CM | POA: Diagnosis not present

## 2019-08-06 DIAGNOSIS — G8929 Other chronic pain: Secondary | ICD-10-CM

## 2019-08-06 DIAGNOSIS — M545 Low back pain, unspecified: Secondary | ICD-10-CM

## 2019-08-06 DIAGNOSIS — R293 Abnormal posture: Secondary | ICD-10-CM

## 2019-08-06 DIAGNOSIS — M228X1 Other disorders of patella, right knee: Secondary | ICD-10-CM | POA: Diagnosis not present

## 2019-08-06 DIAGNOSIS — M2391 Unspecified internal derangement of right knee: Secondary | ICD-10-CM | POA: Diagnosis not present

## 2019-08-06 NOTE — Therapy (Signed)
[facility] [address]. [place], [place], [address] Phone: [phone]   Fax:  [phone]  Physical Therapy Treatment  Patient Details  Name: Annette Snyder MRN: [id number] Date of Birth: [birth year] Referring Provider (PT): Lebron Conners   Encounter Date: 08/06/2019  PT End of Session - 08/06/19 0912    Visit Number  4    Number of Visits  16    Date for PT Re-Evaluation  09/16/19    Authorization Type  CAID    Authorization - Visit Number  4    Authorization - Number of Visits  4    PT Start Time  0900    PT Stop Time  0954    PT Time Calculation (min)  54 min    Activity Tolerance  Patient tolerated treatment well    Behavior During Therapy  [facility] for tasks assessed/performed       Past Medical History:  Diagnosis Date  . Anxiety   . Bipolar disorder ([place])   . Chronic back pain   . Colitis   . PTSD (post-traumatic stress disorder)     History reviewed. No pertinent surgical history.  There were no vitals filed for this visit.  Subjective Assessment - 08/06/19 0907    Subjective  Patient reports her back is feeling very stiff this morning as it does every morning. Patient notes that her HEP has been good and her back is feeling a little better throughout the day. Patient notes that her knee pain is aggravated by walking long distances and stairs.    Currently in Pain?  Yes    Pain Score  6     Pain Location  Back    Pain Orientation  Lower      TREATMENT Neuromuscular Re-education: Supine hooklying diaphragmatic breathing with VCs and TCs for downregulation of the nervous system and improved management of IAP Supine hooklying, TrA activation with exhalation. VCs and TCs to decrease compensatory patterns and minimize aggravation of the lumbar paraspinals. Supine hooklying trunk rotations with coordinated breath for improved lumbar mobility and decreased pain Supine single knee to chest with diaphragmatic  breath for improved lumbar mobility and decreased pain, BLE Supine double knee to chest with diaphragmatic breath for improved lumbar mobility and decreased pain Supine hooklying, TrA activation with exhalation. VCs and TCs to decrease compensatory patterns and minimize aggravation of the lumbar paraspinals. Supine hooklying, TrA activation with UE reach for increased postural stability challenge. VCs and TCs to decrease compensatory patterns and minimize aggravation of the lumbar paraspinals. Supine figure 4 stretch with diaphragmatic breathing for improved lumbar mobility and decreased hip pain  Treatments unbilled: IFC, lumbar region, 8.67m, 90 degree sweep, 80-100 bps for pain relief. No skin irritation or signs of burn noted after removal of pads. 20 minutes during neuromuscular re-education.  Patient educated throughout session on appropriate technique and form using multi-modal cueing, HEP, and activity modification. Patient articulated understanding and returned demonstration.  Patient Response to interventions: 2-3/10 pain at end of session  ASSESSMENT Patient presents to clinic with excellent motivation to participate in therapy. Patient demonstrates deficits in RLE strength, posture, B hip IR pain free motion, pain, lumbar extension AROM, mixed UI. Patient able to achieve 3 point reduction in pain with combined interventions of IFC e-stim with gentle motion and abdominal strengthening during today's session and responded positively to active interventions. Patient will benefit from continued skilled therapeutic intervention to address remaining deficits in RLE strength, posture, B  hip IR pain free motion, pain, lumbar extension AROM, mixed UI in order to increase function, and improve overall QOL.       PT Long Term Goals - 07/22/19 1250      PT LONG TERM GOAL #1   Title  Patient will demonstrate independence with HEP in order to maximize therapeutic gains and improve carryover  from physical therapy sessions to ADLs in the home and community.    Baseline  IE: provided    Time  8    Period  Weeks    Status  New    Target Date  09/16/19      PT LONG TERM GOAL #2   Title  Patient will demonstrate improved function as evidenced by a score of 67 on FOTO measure for full participation in activities at home and in the community.    Baseline  IE: 44    Time  8    Period  Weeks    Status  New    Target Date  09/16/19      PT LONG TERM GOAL #3   Title  Patient will decrease worst pain as reported on NPRS by at least 2 points to demonstrate clinically significant reduction in pain in order to restore/improve function and overall QOL.    Baseline  IE: 9/10    Time  8    Period  Weeks    Status  New    Target Date  09/16/19      PT LONG TERM GOAL #4   Title  Patient will demonstrate pain limited ROM for lumbar extension and B hip IR in order to participate fully in caregiving activities.    Baseline  IE: all painful    Time  8    Period  Weeks    Status  New    Target Date  09/16/19      PT LONG TERM GOAL #5   Title  Patient will improve RLE strength by 1/2 MMT grade in order to demonstrate clinically significant improvement and return to full participation in activities at home and in the community.    Baseline  IE: 4/5    Time  8    Period  Weeks    Status  New    Target Date  09/16/19            Plan - 08/06/19 0912    Clinical Impression Statement  Patient presents to clinic with excellent motivation to participate in therapy. Patient demonstrates deficits in RLE strength, posture, B hip IR pain free motion, pain, lumbar extension AROM, mixed UI. Patient able to achieve 3 point reduction in pain with combined interventions of IFC e-stim with gentle motion and abdominal strengthening during today's session and responded positively to active interventions. Patient will benefit from continued skilled therapeutic intervention to address remaining deficits  in RLE strength, posture, B hip IR pain free motion, pain, lumbar extension AROM, mixed UI in order to increase function, and improve overall QOL.    Personal Factors and Comorbidities  Comorbidity 3+;Past/Current Experience;Time since onset of injury/illness/exacerbation;Sex;Age;Education;Behavior Pattern;Finances    Comorbidities  bipolar depression, ADHD, anxiety, PTSD, colitis    Examination-Activity Limitations  Transfers;Bathing;Dressing;Bed Mobility;Lift;Squat;Sleep;Bend;Stairs;Stand;Reach Overhead    Examination-Participation Restrictions  Personal Finances;Interpersonal Relationship;Yard Work;Laundry;Cleaning;Community Activity    Stability/Clinical Decision Making  Evolving/Moderate complexity    Rehab Potential  Fair    PT Frequency  2x / week    PT Duration  8 weeks    PT  Treatment/Interventions  Cryotherapy;Moist Heat;Electrical Stimulation;Therapeutic activities;Functional mobility training;Stair training;Gait training;Therapeutic exercise;Balance training;Neuromuscular re-education;Patient/family education;Manual techniques;Taping;Splinting;Dry needling;Passive range of motion;Spinal Manipulations;Joint Manipulations;Scar mobilization    PT Next Visit Plan  manual PRN, postural re-education    PT Home Exercise Plan  Access Code: [email or web address]    Consulted and Agree with Plan of Care  Patient       Patient will benefit from skilled therapeutic intervention in order to improve the following deficits and impairments:  Abnormal gait, Decreased endurance, Decreased mobility, Difficulty walking, Increased muscle spasms, Improper body mechanics, Postural dysfunction, Pain, Decreased strength, Decreased coordination, Decreased activity tolerance, Decreased range of motion  Visit Diagnosis: Chronic low back pain without sciatica, unspecified back pain laterality  Muscle weakness (generalized)  Abnormal posture     Problem List Patient Active Problem List   Diagnosis Date Noted  .  Acute bilateral low back pain without sciatica 07/17/2019  . Chronic bilateral low back pain without sciatica 07/17/2019  . Tobacco dependence 07/17/2019  . History of thyroid nodule 07/17/2019  . Bipolar depression ([place]) 07/17/2019  . Binge eating 07/17/2019  . Attention deficit hyperactivity disorder (ADHD) 07/17/2019  . Absolute anemia 07/17/2019  . Ulcerative colitis with rectal bleeding ([place]) 07/17/2019  . Class 2 obesity due to excess calories without serious comorbidity with body mass index (BMI) of 36.0 to 36.9 in adult 07/17/2019   Myles Gip PT, DPT [phone] 08/06/2019, 11:01 AM  [place] [facility] [address] [place], [place], [address] Phone: [phone]   Fax:  [phone]  Name: MALASIA TORAIN MRN: [id number] Date of Birth: 1981/01/21

## 2019-08-08 ENCOUNTER — Ambulatory Visit: Payer: Medicaid Other | Admitting: Physical Therapy

## 2019-08-08 ENCOUNTER — Encounter: Payer: Self-pay | Admitting: Physical Therapy

## 2019-08-08 ENCOUNTER — Other Ambulatory Visit: Payer: Self-pay

## 2019-08-08 DIAGNOSIS — R293 Abnormal posture: Secondary | ICD-10-CM

## 2019-08-08 DIAGNOSIS — M545 Low back pain, unspecified: Secondary | ICD-10-CM

## 2019-08-08 DIAGNOSIS — G8929 Other chronic pain: Secondary | ICD-10-CM | POA: Diagnosis not present

## 2019-08-08 DIAGNOSIS — M6281 Muscle weakness (generalized): Secondary | ICD-10-CM

## 2019-08-08 NOTE — Therapy (Signed)
[facility] [address] [place], [place], [address] Phone: [phone]   Fax:  [phone]  Physical Therapy Treatment  Patient Details  Name: Annette Snyder MRN: [id number] Date of Birth: 06-04-1980 Referring Provider (PT): Lebron Conners   Encounter Date: 08/08/2019  PT End of Session - 08/08/19 1053    Visit Number  5    Number of Visits  16    Date for PT Re-Evaluation  09/16/19    Authorization Type  CAID    Authorization - Visit Number  1    Authorization - Number of Visits  12    PT Start Time  3419    PT Stop Time  1144    PT Time Calculation (min)  55 min    Activity Tolerance  Patient tolerated treatment well    Behavior During Therapy  [facility] for tasks assessed/performed       Past Medical History:  Diagnosis Date  . Anxiety   . Bipolar disorder ([place])   . Chronic back pain   . Colitis   . PTSD (post-traumatic stress disorder)     History reviewed. No pertinent surgical history.  There were no vitals filed for this visit.  Subjective Assessment - 08/08/19 1051    Subjective  Patient states she has some nagging back pain this morning but is not as bad as it was. She notes stiffness as well. Patient notes she has adjusted her sleeping posture which has helped. She saw ortho on Tuesday regarding her knee and feels the visit was productive. She gets an MRI tomorrow and follow up next week with ortho about R knee.    Currently in Pain?  Yes    Pain Score  4     Pain Location  Back    Pain Orientation  Lower       TREATMENT Neuromuscular Re-education: Supine hooklying diaphragmatic breathing with VCs and TCs for downregulation of the nervous system and improved management of IAP Supine hooklying, TrA activation with exhalation. VCs and TCs to decrease compensatory patterns and minimize aggravation of the lumbar paraspinals. Supine figure 4 stretch to chest with diaphragmatic breathing for improved lumbar  mobility and decreased hip pain Supine hooklying trunk rotations with coordinated breath for improved lumbar mobility and decreased pain Supine single knee to chest with diaphragmatic breath for improved lumbar mobility and decreased pain, BLE Supine modified thomas stretch with diaphragmatic breath for improved hip flexor mobility and decreased pain, BLE Supine double knee to chest with diaphragmatic breath for improved lumbar mobility and decreased pain Supine hooklying, TrA activation with exhalation. VCs and TCs to decrease compensatory patterns and minimize aggravation of the lumbar paraspinals. Supine hooklying, TrA activation with UE reach for increased postural stability challenge. VCs and TCs to decrease compensatory patterns and minimize aggravation of the lumbar paraspinals.    Treatments unbilled: IFC, lumbar region, 12.80m, 90 degree sweep, 80-100 bps for pain relief. No skin irritation or signs of burn noted after removal of pads. 20 minutes during neuromuscular re-education.   Patient educated throughout session on appropriate technique and form using multi-modal cueing, HEP, and activity modification. Patient articulated understanding and returned demonstration.   Patient Response to interventions: 1-2/10 pain at end of session   ASSESSMENT Patient presents to clinic with excellent motivation to participate in therapy. Patient demonstrates deficits in RLE strength, posture, B hip IR pain free motion, pain, lumbar extension AROM, mixed UI. Patient continues to find relief with combined movement  and IFC during today's session and responded positively to active interventions. Patient will benefit from continued skilled therapeutic intervention to address remaining deficits in RLE strength, posture, B hip IR pain free motion, pain, lumbar extension AROM, mixed UI in order to increase function, and improve overall QOL.           PT Long Term Goals - 07/22/19 1250      PT  LONG TERM GOAL #1   Title  Patient will demonstrate independence with HEP in order to maximize therapeutic gains and improve carryover from physical therapy sessions to ADLs in the home and community.    Baseline  IE: provided    Time  8    Period  Weeks    Status  New    Target Date  09/16/19      PT LONG TERM GOAL #2   Title  Patient will demonstrate improved function as evidenced by a score of 67 on FOTO measure for full participation in activities at home and in the community.    Baseline  IE: 78    Time  8    Period  Weeks    Status  New    Target Date  09/16/19      PT LONG TERM GOAL #3   Title  Patient will decrease worst pain as reported on NPRS by at least 2 points to demonstrate clinically significant reduction in pain in order to restore/improve function and overall QOL.    Baseline  IE: 9/10    Time  8    Period  Weeks    Status  New    Target Date  09/16/19      PT LONG TERM GOAL #4   Title  Patient will demonstrate pain limited ROM for lumbar extension and B hip IR in order to participate fully in caregiving activities.    Baseline  IE: all painful    Time  8    Period  Weeks    Status  New    Target Date  09/16/19      PT LONG TERM GOAL #5   Title  Patient will improve RLE strength by 1/2 MMT grade in order to demonstrate clinically significant improvement and return to full participation in activities at home and in the community.    Baseline  IE: 4/5    Time  8    Period  Weeks    Status  New    Target Date  09/16/19            Plan - 08/08/19 1055    Clinical Impression Statement  Patient presents to clinic with excellent motivation to participate in therapy. Patient demonstrates deficits in RLE strength, posture, B hip IR pain free motion, pain, lumbar extension AROM, mixed UI. Patient continues to find relief with combined movement and IFC during today's session and responded positively to active interventions. Patient will benefit from continued  skilled therapeutic intervention to address remaining deficits in RLE strength, posture, B hip IR pain free motion, pain, lumbar extension AROM, mixed UI in order to increase function, and improve overall QOL.    Personal Factors and Comorbidities  Comorbidity 3+;Past/Current Experience;Time since onset of injury/illness/exacerbation;Sex;Age;Education;Behavior Pattern;Finances    Comorbidities  bipolar depression, ADHD, anxiety, PTSD, colitis    Examination-Activity Limitations  Transfers;Bathing;Dressing;Bed Mobility;Lift;Squat;Sleep;Bend;Stairs;Stand;Reach Overhead    Examination-Participation Restrictions  Personal Finances;Interpersonal Relationship;Yard Work;Laundry;Cleaning;Community Activity    Stability/Clinical Decision Making  Evolving/Moderate complexity    Rehab Potential  Fair  PT Frequency  2x / week    PT Duration  8 weeks    PT Treatment/Interventions  Cryotherapy;Moist Heat;Electrical Stimulation;Therapeutic activities;Functional mobility training;Stair training;Gait training;Therapeutic exercise;Balance training;Neuromuscular re-education;Patient/family education;Manual techniques;Taping;Splinting;Dry needling;Passive range of motion;Spinal Manipulations;Joint Manipulations;Scar mobilization    PT Next Visit Plan  manual PRN, postural re-education    PT Home Exercise Plan  Access Code: [email or web address]    Consulted and Agree with Plan of Care  Patient       Patient will benefit from skilled therapeutic intervention in order to improve the following deficits and impairments:  Abnormal gait, Decreased endurance, Decreased mobility, Difficulty walking, Increased muscle spasms, Improper body mechanics, Postural dysfunction, Pain, Decreased strength, Decreased coordination, Decreased activity tolerance, Decreased range of motion  Visit Diagnosis: Chronic low back pain without sciatica, unspecified back pain laterality  Muscle weakness (generalized)  Abnormal posture     Problem  List Patient Active Problem List   Diagnosis Date Noted  . Acute bilateral low back pain without sciatica 07/17/2019  . Chronic bilateral low back pain without sciatica 07/17/2019  . Tobacco dependence 07/17/2019  . History of thyroid nodule 07/17/2019  . Bipolar depression ([place]) 07/17/2019  . Binge eating 07/17/2019  . Attention deficit hyperactivity disorder (ADHD) 07/17/2019  . Absolute anemia 07/17/2019  . Ulcerative colitis with rectal bleeding ([place]) 07/17/2019  . Class 2 obesity due to excess calories without serious comorbidity with body mass index (BMI) of 36.0 to 36.9 in adult 07/17/2019   Myles Gip PT, DPT [id number] 08/08/2019, 11:46 AM  [facility] [address] [place], [place], [address] Phone: [phone]   Fax:  [phone]  Name: Annette Snyder MRN: [id number] Date of Birth: 08-07-1980

## 2019-08-09 DIAGNOSIS — M7989 Other specified soft tissue disorders: Secondary | ICD-10-CM | POA: Diagnosis not present

## 2019-08-09 DIAGNOSIS — S83281A Other tear of lateral meniscus, current injury, right knee, initial encounter: Secondary | ICD-10-CM | POA: Diagnosis not present

## 2019-08-09 DIAGNOSIS — S83511A Sprain of anterior cruciate ligament of right knee, initial encounter: Secondary | ICD-10-CM | POA: Diagnosis not present

## 2019-08-09 DIAGNOSIS — M23051 Cystic meniscus, posterior horn of lateral meniscus, right knee: Secondary | ICD-10-CM | POA: Diagnosis not present

## 2019-08-09 DIAGNOSIS — S83241A Other tear of medial meniscus, current injury, right knee, initial encounter: Secondary | ICD-10-CM | POA: Diagnosis not present

## 2019-08-09 DIAGNOSIS — M948X6 Other specified disorders of cartilage, lower leg: Secondary | ICD-10-CM | POA: Diagnosis not present

## 2019-08-13 ENCOUNTER — Ambulatory Visit: Payer: Medicaid Other | Admitting: Physical Therapy

## 2019-08-13 ENCOUNTER — Encounter: Payer: Self-pay | Admitting: Physical Therapy

## 2019-08-13 ENCOUNTER — Other Ambulatory Visit: Payer: Self-pay

## 2019-08-13 DIAGNOSIS — M6281 Muscle weakness (generalized): Secondary | ICD-10-CM

## 2019-08-13 DIAGNOSIS — R293 Abnormal posture: Secondary | ICD-10-CM | POA: Diagnosis not present

## 2019-08-13 DIAGNOSIS — M545 Low back pain, unspecified: Secondary | ICD-10-CM

## 2019-08-13 DIAGNOSIS — G8929 Other chronic pain: Secondary | ICD-10-CM | POA: Diagnosis not present

## 2019-08-13 NOTE — Progress Notes (Signed)
Name: Annette Snyder   MRN: [id number]    DOB: [birth year]   Date:08/14/2019       Progress Note  Subjective  Chief Complaint  Chief Complaint  Patient presents with  . Follow-up  . Back Pain    going to PT doing stretches and excersice, using a tens unit  . Knee Pain    Right knee, recent MRI at [facility] at ER    I connected with  Jose Persia  on 08/14/19 at 10:00 AM EDT by a video enabled telemedicine application and verified that I am speaking with the correct person using two identifiers.  I discussed the limitations of evaluation and management by telemedicine and the availability of in person appointments. The patient expressed understanding and agreed to proceed. Staff also discussed with the patient that there may be a patient responsible charge related to this service. Patient Location: Home Provider Location: [facility] Additional Individuals present: none Patient did not respond trying to connect with a telemedicine visit, and I called her and we continued the visit by phone with her approval HPI  Patient is a 39 year old female who presented new to the practice on 07/17/2019 with that note reviewed. Her main complaint at that visit was persistent back pain, and a desire to quit smoking. Conservative measures were recommended including the addition of a Mobic and Flexeril product, and a physical therapy referral for her back pain which was felt to have a large mechanical component to it source. Nicotine supplements to help with smoking cessation. She has also been followed at the [facility] system for a lot of her previous care, and also more recently followed by bariatrics at the weight management center to help with desired weight loss.  She was not felt to be a candidate for bariatric surgery with her current BMI. After our last visit, she went to the [facility] emergency room on 07/26/2019 for acute on chronic right knee pain.  She was treated with Toradol and a steroid injection, no imaging was  done at that visit, and prescribed a Voltaren product and meloxicam to take on discharge, and was to follow-up with her PCP in 1 week noted. She follows up today.   Back Pain - She noted last visit that she has had low back pain for the last couple years, and was worsened in the past 6 months.  She noted she had a prior x-ray about 3 weeks prior at the [place], also prior oral steroid doses, and also a shot of a Toradol type medicine for management prior. She notes the back pain persists, with the note from her last physical therapy appointment on 08/08/2019 noting the following:  Patient states she has some nagging back pain this morning but is not as bad as it was. She notes stiffness as well. Patient notes she has adjusted her sleeping posture which has helped. She is currently doing exercises, stretching, and has a TENS unit to use to help, notes is feeling a little better.  Pain still felt in lower back more and across the lower back in the belt-like pattern.  Not more one-sided. No pain radiates down the legs.  No numbness, tingling or marked weakness in LE's, no foot drop No fevers or other infectious symptoms of concern    Tob dependence -patient has been an everyday smoker since 2017, 1/2 PPD noted last visit Has tried to quit cold [place] twice in past without success.   Has slowed down use, using about  5/day presently Discussed nicotine supplements and other medicines like Chantix last visit, and she wanted to try the nicotine supplements. She did have a phone consult with the [facility] tobacco treatment program yesterday, with that note reviewed.  She is still very committed to trying to quit smoking presently and was told she needs a prescription for the patch, 21 mg dose to help lessen the cravings, and she states that was recommended by the [facility] people.  It is over-the-counter, but will put a prescription through to her pharmacy as well to help.  Discussed after about 4 weeks, reducing to the  14 mg patch, and then the 7 mg patch after that and hopefully have success with tobacco cessation. She will contact us in about 4 weeks when it is time to reduce the dosage of the patch and will update [place] at that time.  Right knee pain-recent ER visit as noted above, felt to be more acute on chronic She subsequently followed up with orthopedics on 08/06/2019, with a patellofemoral component felt to exist and a home exercise regimen recommended with also a concern for potential internal derangement and an MRI ordered.  She also received a brace to wear. The MRI results from the study done 08/09/2019 were as follows: Impressions Performed At  Noncontrast MRI of the right knee-    1. Horizontal undersurface tear of the body segment of the lateral meniscus with associated parameniscal cyst.  2. Lateral patellar tilt with shallow trochlea. Multifocal partial-thickness chondral loss of the patella and trochlear groove. These findings can be seen with patellar maltracking.  3. Focal soft tissue edema within the superior-lateral aspect of the infrapatellar fat pad, which can be seen in lateral femoral condyle-patellar tendon friction syndrome.  4. Partial-thickness chondral loss within the central medial and lateral femoral condyles and lateral tibial plateau.  5. Sprain or chronic injury of the fibular collateral ligament.  6. 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     She is still awaiting follow-up from the orthopedist after the MRI was obtained.  She saw the results but was not clear on some of the findings.  Has a follow-up appt. on Friday. I briefly reviewed the findings with her, specifically the meniscal issue and the anterior cruciate partial tear and I note noted clinical correlation critical and deferred to ortho for best next steps.  She was understanding of that and will keep her follow-up appointment.  Other problems being monitored/treated  included:  Prediabetes: Currently untreated with medications. Initial hemoglobin A1c 5.7%.Followed at [facility] presently  Bipolar depression: Currently treated by psychiatrist at [facility],  and she noted that continues. Current medications - Lexapro, Buspar, and abilify presently, gets her Abilify shot monthly.  She notes meds helpful last visit.  Binge eating and ADHD: prior treatment with Vyvanse. Discontinued previously due to outside provider's lack of ability to prescribe controlled substances. Currently taking Vyvanse 50 mg. She will continue to have those providers write the prescription for her and provide.  Anemia history: 05/08/2019 - hgb 11.7 with low red blood cell indices, prior CBCs from 2018 and prior reviewed, noted she was more anemic over that time  Iron panel done 05/08/19 -had a low iron level, low ferritin, low iron saturation - 26, 6.5 and 6 respectively, vitamin B12 level was normal Just saw hematologist a week prior to our last visit, had iron infusion at [place] (Iron pills make constipated and not good for her UC so gets infusions). She continues to follow  at the [place].  Ulcerative colitis  Dx'ed with scope, 2017, most recent scope in Feb, followed by GI'ist Takes mesalamine supp's to manage, also Linzess.  Thyroid nodule history -she noted she had a thyroid nodule discovered when she was in [place], had a biopsy done, and was told was benign.  Has been followed at the [place] after.   Stays alone at home with 4 kids. Mom helps, sister help    Patient Active Problem List   Diagnosis Date Noted  . Acute bilateral low back pain without sciatica 07/17/2019  . Chronic bilateral low back pain without sciatica 07/17/2019  . Tobacco dependence 07/17/2019  . History of thyroid nodule 07/17/2019  . Bipolar depression ([place]) 07/17/2019  . Binge eating 07/17/2019  . Attention deficit hyperactivity disorder (ADHD) 07/17/2019  . Absolute anemia 07/17/2019  . Ulcerative colitis with rectal  bleeding ([place]) 07/17/2019  . Class 2 obesity due to excess calories without serious comorbidity with body mass index (BMI) of 36.0 to 36.9 in adult 07/17/2019    History reviewed. No pertinent surgical history.  Family History  Problem Relation Age of Onset  . Diabetes Mother   . Glaucoma Mother   . Hypertension Mother   . Blindness Mother   . Hypertension Father   . Stroke Father   . Bipolar disorder Brother   . Crohn's disease Brother   . Depression Maternal Grandmother   . Depression Maternal Grandfather   . Bipolar disorder Brother     Social History   Tobacco Use  . Smoking status: Current Every Day Smoker    Types: Cigarettes  . Smokeless tobacco: Never Used  Substance Use Topics  . Alcohol use: No     Current Outpatient Medications:  .  ARIPiprazole ER (ABILIFY MAINTENA) 300 MG SRER injection, Inject 300 mg into the muscle every 28 (twenty-eight) days., Disp: , Rfl:  .  busPIRone (BUSPAR) 15 MG tablet, Take 15 mg by mouth 3 (three) times daily., Disp: , Rfl:  .  Cetirizine HCl 10 MG CAPS, Take by mouth., Disp: , Rfl:  .  cyclobenzaprine (FLEXERIL) 5 MG tablet, Take 1 tablet (5 mg total) by mouth at bedtime. May increase to two tabs at bedtime pending response, Disp: 60 tablet, Rfl: 1 .  escitalopram (LEXAPRO) 10 MG tablet, Take 10 mg by mouth daily., Disp: , Rfl:  .  lisdexamfetamine (VYVANSE) 20 MG capsule, Take 20 mg by mouth daily., Disp: , Rfl:  .  meloxicam (MOBIC) 15 MG tablet, Take 1 tablet (15 mg total) by mouth daily., Disp: 30 tablet, Rfl: 2  Allergies  Allergen Reactions  . Flagyl [Metronidazole] Rash  . Seroquel [Quetiapine] Rash    With staff assistance, above reviewed with the patient today.   ROS: As per HPI, otherwise no specific complaints on a limited and focused system review   Objective  Virtual encounter, vitals not obtained.  Body mass index is 33.84 kg/m.  Physical Exam  Patient appears in NAD, pleasant Breathing: Effort  normal. No respiratory distress. Speaking in complete sentences Neurological: Pt is alert and oriented,  Speech is normal.  Psychiatric: Patient has a normal mood and affect, Very appropriate with conversation, judgment and thought content normal.   No results found for this or any previous visit (from the past 72 hour(s)).  PHQ2/9: Depression screen [facility] 2/9 08/14/2019 07/17/2019  Decreased Interest 0 0  Down, Depressed, Hopeless 0 0  PHQ - 2 Score 0 0  Altered sleeping 3 3  Tired, decreased  energy 3 1  Change in appetite 0 1  Feeling bad or failure about yourself  0 0  Trouble concentrating 0 1  Moving slowly or fidgety/restless 0 0  Suicidal thoughts 0 0  PHQ-9 Score 6 6  Difficult doing work/chores Very difficult Somewhat difficult   PHQ-2/9 Result reviewed   Fall Risk: Fall Risk  08/14/2019 07/17/2019  Falls in the past year? 0 0  Number falls in past yr: 0 0  Injury with Fall? 0 0     Assessment & Plan  1. Tobacco dependence Has had some success reducing the number of cigarettes, and had recent contact with the [facility] tobacco cessation program for recommendations as well.  A nicotine patch was felt to be a good next step, and discussed this with her.  We will use the 21 mg patch for 4 weeks, and then reduce to 14 mg and then 7 mg over time before stopping, in hopes of reaching complete tobacco cessation. She will contact us in about 4 weeks, and update [place], and if helpful, will lessen to the 14 mg patch at that point.  Did note these are available over-the-counter, although a prescription was requested and 1 was sent to her pharmacy. Did note if we are not having success with the nicotine supplements, will follow up to entertain other options including a potential medicine that can help as well.  She was in agreement with this - nicotine (NICODERM CQ - DOSED IN MG/24 HOURS) 21 mg/24hr patch; Place 1 patch (21 mg total) onto the skin daily.  Dispense: 28 patch; Refill: 0  2. Acute  bilateral low back pain without sciatica Has noted some help with physical therapy, and doing the exercises and stretches at home as recommended.  Emphasized the importance of the home program component, and doing exercises on a daily basis. Continue with PT presently.  3. Chronic pain of right knee, acute on chronic recent past As noted above, clinical correlation is felt to be critical with the MRI findings, and await follow-up with orthopedics and the recommendations.  May recommend a scoping procedure as part of the management noted, although await their input.  She noted she still is seeing the providers at the [place] with some of the issues noted above, and had no other concerns today to address. Await an update in 4 weeks time with how she is doing with the nicotine patches and tobacco cessation, and should follow-up sooner as needed.   I discussed the assessment and treatment plan with the patient. The patient was provided an opportunity to ask questions and all were answered. The patient agreed with the plan and demonstrated an understanding of the instructions.  The patient was advised to call back or seek an in-person evaluation if the symptoms worsen or if the condition fails to improve as anticipated.  I provided 20 minutes of non-face-to-face time during this encounter that included discussing at length patient's sx/history, pertinent pmhx, medications, treatment and follow up plan. This time also included the necessary documentation, orders, and chart review.

## 2019-08-13 NOTE — Therapy (Signed)
[facility] [address]. [place], [place], [address] Phone: [phone]   Fax:  [phone]  Physical Therapy Treatment  Patient Details  Name: Annette Snyder MRN: [id number] Date of Birth: 1980/06/08 Referring Provider (PT): Lebron Conners   Encounter Date: 08/13/2019  PT End of Session - 08/13/19 1056    Visit Number  6    Number of Visits  16    Date for PT Re-Evaluation  09/16/19    Authorization Type  CAID    Authorization - Visit Number  2    Authorization - Number of Visits  12    PT Start Time  1046    PT Stop Time  1140    PT Time Calculation (min)  54 min    Activity Tolerance  Patient tolerated treatment well    Behavior During Therapy  [facility] for tasks assessed/performed       Past Medical History:  Diagnosis Date  . Anxiety   . Bipolar disorder ([place])   . Chronic back pain   . Colitis   . PTSD (post-traumatic stress disorder)     History reviewed. No pertinent surgical history.  There were no vitals filed for this visit.  Subjective Assessment - 08/13/19 1053    Subjective  Patient notes that she saw her results from her R knee MRI is worries about what the next steps will be. Patient notes that she still feels a little stiffness in her back, but it's not hurting like it was.    Currently in Pain?  Yes    Pain Score  1     Pain Location  Back    Pain Orientation  Lower       TREATMENT Neuromuscular Re-education: Supine hooklying diaphragmatic breathing with VCs and TCs for downregulation of the nervous system and improved management of IAP Supine single knee to chest with diaphragmatic breath for improved lumbar mobility and decreased pain, BLE Supine modified thomas stretch with diaphragmatic breath for improved hip flexor mobility and decreased pain, BLE Supine hooklying, TrA activation with exhalation. VCs and TCs to decrease compensatory patterns and minimize aggravation of the lumbar  paraspinals. Supine hooklying, TrA activation marching with coordinated breath. VCs and TCs to decrease compensatory patterns and minimize aggravation of the lumbar paraspinals. Supine hooklying, TrA activation heel slides with coordinated breath. VCs and TCs to decrease compensatory patterns and minimize aggravation of the lumbar paraspinals. Supine figure 4 stretch to chest with diaphragmatic breathing for improved lumbar mobility and decreased hip pain Supine hooklying trunk rotations with coordinated breath for improved lumbar mobility and decreased pain Supine double knee to chest with diaphragmatic breath for improved lumbar mobility and decreased pain    Treatments unbilled: IFC, lumbar region, 12.43m, 90 degree sweep, 80-100 bps for pain relief. No skin irritation or signs of burn noted after removal of pads. 20 minutes during neuromuscular re-education.   Patient educated throughout session on appropriate technique and form using multi-modal cueing, HEP, and activity modification. Patient articulated understanding and returned demonstration.   Patient Response to interventions: 0.5/10 pain at end of session   ASSESSMENT Patient presents to clinic with excellent motivation to participate in therapy. Patient demonstrates deficits in RLE strength, posture, B hip IR pain free motion, pain, lumbar extension AROM, mixed UI. Patient able to perform phase 1 and phase 2 of [facility] during today's session and responded positively to active interventions combined with pain modulation interventions. Patient will benefit from continued  skilled therapeutic intervention to address remaining deficits in RLE strength, posture, B hip IR pain free motion, pain, lumbar extension AROM, mixed UI in order to increase function, and improve overall QOL.    PT Long Term Goals - 07/22/19 1250      PT LONG TERM GOAL #1   Title  Patient will demonstrate independence with HEP in order to  maximize therapeutic gains and improve carryover from physical therapy sessions to ADLs in the home and community.    Baseline  IE: provided    Time  8    Period  Weeks    Status  New    Target Date  09/16/19      PT LONG TERM GOAL #2   Title  Patient will demonstrate improved function as evidenced by a score of 67 on FOTO measure for full participation in activities at home and in the community.    Baseline  IE: 37    Time  8    Period  Weeks    Status  New    Target Date  09/16/19      PT LONG TERM GOAL #3   Title  Patient will decrease worst pain as reported on NPRS by at least 2 points to demonstrate clinically significant reduction in pain in order to restore/improve function and overall QOL.    Baseline  IE: 9/10    Time  8    Period  Weeks    Status  New    Target Date  09/16/19      PT LONG TERM GOAL #4   Title  Patient will demonstrate pain limited ROM for lumbar extension and B hip IR in order to participate fully in caregiving activities.    Baseline  IE: all painful    Time  8    Period  Weeks    Status  New    Target Date  09/16/19      PT LONG TERM GOAL #5   Title  Patient will improve RLE strength by 1/2 MMT grade in order to demonstrate clinically significant improvement and return to full participation in activities at home and in the community.    Baseline  IE: 4/5    Time  8    Period  Weeks    Status  New    Target Date  09/16/19            Plan - 08/13/19 1143    Clinical Impression Statement  Patient presents to clinic with excellent motivation to participate in therapy. Patient demonstrates deficits in RLE strength, posture, B hip IR pain free motion, pain, lumbar extension AROM, mixed UI. Patient able to perform phase 1 and phase 2 of [facility] during today's session and responded positively to active interventions combined with pain modulation interventions. Patient will benefit from continued skilled therapeutic  intervention to address remaining deficits in RLE strength, posture, B hip IR pain free motion, pain, lumbar extension AROM, mixed UI in order to increase function, and improve overall QOL.    Personal Factors and Comorbidities  Comorbidity 3+;Past/Current Experience;Time since onset of injury/illness/exacerbation;Sex;Age;Education;Behavior Pattern;Finances    Comorbidities  bipolar depression, ADHD, anxiety, PTSD, colitis    Examination-Activity Limitations  Transfers;Bathing;Dressing;Bed Mobility;Lift;Squat;Sleep;Bend;Stairs;Stand;Reach Overhead    Examination-Participation Restrictions  Personal Finances;Interpersonal Relationship;Yard Work;Laundry;Cleaning;Community Activity    Stability/Clinical Decision Making  Evolving/Moderate complexity    Rehab Potential  Fair    PT Frequency  2x / week    PT Duration  8 weeks    PT Treatment/Interventions  Cryotherapy;Moist Heat;Electrical Stimulation;Therapeutic activities;Functional mobility training;Stair training;Gait training;Therapeutic exercise;Balance training;Neuromuscular re-education;Patient/family education;Manual techniques;Taping;Splinting;Dry needling;Passive range of motion;Spinal Manipulations;Joint Manipulations;Scar mobilization    PT Next Visit Plan  manual PRN, postural re-education    PT Home Exercise Plan  Access Code: [email or web address]    Consulted and Agree with Plan of Care  Patient       Patient will benefit from skilled therapeutic intervention in order to improve the following deficits and impairments:  Abnormal gait, Decreased endurance, Decreased mobility, Difficulty walking, Increased muscle spasms, Improper body mechanics, Postural dysfunction, Pain, Decreased strength, Decreased coordination, Decreased activity tolerance, Decreased range of motion  Visit Diagnosis: Chronic low back pain without sciatica, unspecified back pain laterality  Muscle weakness (generalized)  Abnormal posture     Problem List Patient Active  Problem List   Diagnosis Date Noted  . Acute bilateral low back pain without sciatica 07/17/2019  . Chronic bilateral low back pain without sciatica 07/17/2019  . Tobacco dependence 07/17/2019  . History of thyroid nodule 07/17/2019  . Bipolar depression ([place]) 07/17/2019  . Binge eating 07/17/2019  . Attention deficit hyperactivity disorder (ADHD) 07/17/2019  . Absolute anemia 07/17/2019  . Ulcerative colitis with rectal bleeding ([place]) 07/17/2019  . Class 2 obesity due to excess calories without serious comorbidity with body mass index (BMI) of 36.0 to 36.9 in adult 07/17/2019   Myles Gip PT, DPT [phone] 08/13/2019, 11:47 AM  [facility] [address]. [place], [place], [address] Phone: [phone]   Fax:  [phone]  Name: Annette Snyder MRN: [id number] Date of Birth: 1980-08-13

## 2019-08-14 ENCOUNTER — Encounter: Payer: Self-pay | Admitting: Internal Medicine

## 2019-08-14 ENCOUNTER — Ambulatory Visit (INDEPENDENT_AMBULATORY_CARE_PROVIDER_SITE_OTHER): Payer: Medicaid Other | Admitting: Internal Medicine

## 2019-08-14 VITALS — Ht 62.0 in | Wt 185.0 lb

## 2019-08-14 DIAGNOSIS — F17200 Nicotine dependence, unspecified, uncomplicated: Secondary | ICD-10-CM

## 2019-08-14 DIAGNOSIS — M545 Low back pain, unspecified: Secondary | ICD-10-CM

## 2019-08-14 DIAGNOSIS — M25561 Pain in right knee: Secondary | ICD-10-CM

## 2019-08-14 DIAGNOSIS — G8929 Other chronic pain: Secondary | ICD-10-CM | POA: Diagnosis not present

## 2019-08-14 MED ORDER — NICOTINE 21 MG/24HR TD PT24: 21.0000 mg | MEDICATED_PATCH | Freq: Every day | TRANSDERMAL | 0 refills | Status: AC

## 2019-08-15 ENCOUNTER — Encounter: Payer: Self-pay | Admitting: Physical Therapy

## 2019-08-15 ENCOUNTER — Ambulatory Visit: Payer: Medicaid Other | Admitting: Physical Therapy

## 2019-08-15 ENCOUNTER — Other Ambulatory Visit: Payer: Self-pay

## 2019-08-15 DIAGNOSIS — R293 Abnormal posture: Secondary | ICD-10-CM

## 2019-08-15 DIAGNOSIS — M6281 Muscle weakness (generalized): Secondary | ICD-10-CM | POA: Diagnosis not present

## 2019-08-15 DIAGNOSIS — G8929 Other chronic pain: Secondary | ICD-10-CM | POA: Diagnosis not present

## 2019-08-15 DIAGNOSIS — M545 Low back pain: Secondary | ICD-10-CM | POA: Diagnosis not present

## 2019-08-15 NOTE — Therapy (Signed)
[facility] [address]. [place], [place], [address] Phone: [phone]   Fax:  [phone]  Physical Therapy Treatment  Patient Details  Name: Annette Snyder MRN: [id number] Date of Birth: Aug 16, 1980 Referring Provider (PT): Lebron Conners   Encounter Date: 08/15/2019  PT End of Session - 08/15/19 1053    Visit Number  7    Number of Visits  16    Date for PT Re-Evaluation  09/16/19    Authorization Type  CAID    Authorization - Visit Number  3    Authorization - Number of Visits  12    PT Start Time  1050    PT Stop Time  1145    PT Time Calculation (min)  55 min    Activity Tolerance  Patient tolerated treatment well    Behavior During Therapy  [facility] for tasks assessed/performed       Past Medical History:  Diagnosis Date  . Anxiety   . Bipolar disorder ([place])   . Chronic back pain   . Colitis   . PTSD (post-traumatic stress disorder)     History reviewed. No pertinent surgical history.  There were no vitals filed for this visit.  Subjective Assessment - 08/15/19 1052    Subjective  Patient reports that she was not in pain after last session and tolerated the exercises without increased discomfort. Patient notes that she continues to feel stiff but is better able to extend her hips and spine when standing. She notes a mild dull ache.    Currently in Pain?  Yes    Pain Score  1     Pain Location  Back    Pain Orientation  Lower       TREATMENT Neuromuscular Re-education: Supine hooklying diaphragmatic breathing with VCs and TCs for downregulation of the nervous system and improved management of IAP Supine modified thomas stretch with diaphragmatic breath for improved hip flexor mobility and decreased pain, BLE Supine hooklying, TrA activation with exhalation. VCs and TCs to decrease compensatory patterns and minimize aggravation of the lumbar paraspinals. Supine hooklying, TrA activation bent knee fall out  with coordinated breath. VCs and TCs to decrease compensatory patterns and minimize aggravation of the lumbar paraspinals. Supine hooklying, TrA activation heel slides with coordinated breath. VCs and TCs to decrease compensatory patterns and minimize aggravation of the lumbar paraspinals. Supine hooklying, TrA activation single leg circles (CW/CCW) with coordinated breath. VCs and TCs to decrease compensatory patterns and minimize aggravation of the lumbar paraspinals. Supine figure 4 stretch to chest with diaphragmatic breathing for improved lumbar mobility and decreased hip pain Supine hooklying trunk rotations with coordinated breath for improved lumbar mobility and decreased pain Standing Pilates Postural Control Hug a Tree, GTB, x10 Serve a Tray, GTB, x10    Treatments unbilled: IFC, lumbar region, 12.22m, 90 degree sweep, 80-100 bps for pain relief. No skin irritation or signs of burn noted after removal of pads. 20 minutes during neuromuscular re-education.   Patient educated throughout session on appropriate technique and form using multi-modal cueing, HEP, and activity modification. Patient articulated understanding and returned demonstration.   Patient Response to interventions: Confident with HEP additions   ASSESSMENT Patient presents to clinic with excellent motivation to participate in therapy. Patient demonstrates deficits in RLE strength, posture, B hip IR pain free motion, pain, lumbar extension AROM, mixed UI. Patient able to maintain good form with standing postural and supine postural control activities during today's session and responded  positively to active interventions combined with pain modulation interventions. Patient will benefit from continued skilled therapeutic intervention to address remaining deficits in RLE strength, posture, B hip IR pain free motion, pain, lumbar extension AROM, mixed UI in order to increase function, and improve overall QOL.    PT Long  Term Goals - 07/22/19 1250      PT LONG TERM GOAL #1   Title  Patient will demonstrate independence with HEP in order to maximize therapeutic gains and improve carryover from physical therapy sessions to ADLs in the home and community.    Baseline  IE: provided    Time  8    Period  Weeks    Status  New    Target Date  09/16/19      PT LONG TERM GOAL #2   Title  Patient will demonstrate improved function as evidenced by a score of 67 on FOTO measure for full participation in activities at home and in the community.    Baseline  IE: 37    Time  8    Period  Weeks    Status  New    Target Date  09/16/19      PT LONG TERM GOAL #3   Title  Patient will decrease worst pain as reported on NPRS by at least 2 points to demonstrate clinically significant reduction in pain in order to restore/improve function and overall QOL.    Baseline  IE: 9/10    Time  8    Period  Weeks    Status  New    Target Date  09/16/19      PT LONG TERM GOAL #4   Title  Patient will demonstrate pain limited ROM for lumbar extension and B hip IR in order to participate fully in caregiving activities.    Baseline  IE: all painful    Time  8    Period  Weeks    Status  New    Target Date  09/16/19      PT LONG TERM GOAL #5   Title  Patient will improve RLE strength by 1/2 MMT grade in order to demonstrate clinically significant improvement and return to full participation in activities at home and in the community.    Baseline  IE: 4/5    Time  8    Period  Weeks    Status  New    Target Date  09/16/19            Plan - 08/15/19 1056    Clinical Impression Statement  Patient presents to clinic with excellent motivation to participate in therapy. Patient demonstrates deficits in RLE strength, posture, B hip IR pain free motion, pain, lumbar extension AROM, mixed UI. Patient able to maintain good form with standing postural and supine postural control activities during today's session and responded  positively to active interventions combined with pain modulation interventions. Patient will benefit from continued skilled therapeutic intervention to address remaining deficits in RLE strength, posture, B hip IR pain free motion, pain, lumbar extension AROM, mixed UI in order to increase function, and improve overall QOL.    Personal Factors and Comorbidities  Comorbidity 3+;Past/Current Experience;Time since onset of injury/illness/exacerbation;Sex;Age;Education;Behavior Pattern;Finances    Comorbidities  bipolar depression, ADHD, anxiety, PTSD, colitis    Examination-Activity Limitations  Transfers;Bathing;Dressing;Bed Mobility;Lift;Squat;Sleep;Bend;Stairs;Stand;Reach Overhead    Examination-Participation Restrictions  Personal Finances;Interpersonal Relationship;Yard Work;Laundry;Cleaning;Community Activity    Stability/Clinical Decision Making  Evolving/Moderate complexity    Rehab Potential  Fair  PT Frequency  2x / week    PT Duration  8 weeks    PT Treatment/Interventions  Cryotherapy;Moist Heat;Electrical Stimulation;Therapeutic activities;Functional mobility training;Stair training;Gait training;Therapeutic exercise;Balance training;Neuromuscular re-education;Patient/family education;Manual techniques;Taping;Splinting;Dry needling;Passive range of motion;Spinal Manipulations;Joint Manipulations;Scar mobilization    PT Next Visit Plan  manual PRN, postural re-education    PT Home Exercise Plan  Access Code: [email or web address]    Consulted and Agree with Plan of Care  Patient       Patient will benefit from skilled therapeutic intervention in order to improve the following deficits and impairments:  Abnormal gait, Decreased endurance, Decreased mobility, Difficulty walking, Increased muscle spasms, Improper body mechanics, Postural dysfunction, Pain, Decreased strength, Decreased coordination, Decreased activity tolerance, Decreased range of motion  Visit Diagnosis: Chronic low back pain without  sciatica, unspecified back pain laterality  Muscle weakness (generalized)  Abnormal posture     Problem List Patient Active Problem List   Diagnosis Date Noted  . Acute bilateral low back pain without sciatica 07/17/2019  . Chronic bilateral low back pain without sciatica 07/17/2019  . Tobacco dependence 07/17/2019  . History of thyroid nodule 07/17/2019  . Bipolar depression ([place]) 07/17/2019  . Binge eating 07/17/2019  . Attention deficit hyperactivity disorder (ADHD) 07/17/2019  . Absolute anemia 07/17/2019  . Ulcerative colitis with rectal bleeding ([place]) 07/17/2019  . Class 2 obesity due to excess calories without serious comorbidity with body mass index (BMI) of 36.0 to 36.9 in adult 07/17/2019   Myles Gip PT, DPT [phone] 08/15/2019, 12:47 PM  [facility] [address]. [place], [place], [address] Phone: [phone]   Fax:  [phone]  Name: Annette Snyder MRN: [id number] Date of Birth: 03/17/1981

## 2019-08-16 DIAGNOSIS — M228X1 Other disorders of patella, right knee: Secondary | ICD-10-CM | POA: Diagnosis not present

## 2019-08-20 ENCOUNTER — Ambulatory Visit: Payer: Medicaid Other | Attending: Internal Medicine | Admitting: Physical Therapy

## 2019-08-20 ENCOUNTER — Other Ambulatory Visit: Payer: Self-pay

## 2019-08-20 ENCOUNTER — Encounter: Payer: Self-pay | Admitting: Physical Therapy

## 2019-08-20 DIAGNOSIS — M6281 Muscle weakness (generalized): Secondary | ICD-10-CM | POA: Insufficient documentation

## 2019-08-20 DIAGNOSIS — M545 Low back pain, unspecified: Secondary | ICD-10-CM

## 2019-08-20 DIAGNOSIS — R293 Abnormal posture: Secondary | ICD-10-CM | POA: Diagnosis not present

## 2019-08-20 DIAGNOSIS — G8929 Other chronic pain: Secondary | ICD-10-CM | POA: Diagnosis not present

## 2019-08-20 NOTE — Therapy (Signed)
[facility] [address] [place], [place], [address] Phone: [phone]   Fax:  [phone]  Physical Therapy Treatment  Patient Details  Name: Annette Snyder MRN: [id number] Date of Birth: 1980-04-29 Referring Provider (PT): Lebron Conners   Encounter Date: 08/20/2019  PT End of Session - 08/20/19 1220    Visit Number  8    Number of Visits  16    Date for PT Re-Evaluation  09/16/19    Authorization Type  CAID    Authorization - Visit Number  3    Authorization - Number of Visits  12    PT Start Time  8938    PT Stop Time  1140    PT Time Calculation (min)  55 min    Activity Tolerance  Patient tolerated treatment well    Behavior During Therapy  [facility] for tasks assessed/performed       Past Medical History:  Diagnosis Date  . Anxiety   . Bipolar disorder ([place])   . Chronic back pain   . Colitis   . PTSD (post-traumatic stress disorder)     History reviewed. No pertinent surgical history.  There were no vitals filed for this visit.  Subjective Assessment - 08/20/19 1051    Subjective  Patient states that she follows up with orthopedics on Thursday at 930 in regards to her R knee which has continued to be painful. Patient notes that her back has been very stiff but also notes that she hasn't been doing her stretches.       TREATMENT Neuromuscular Re-education: Application of KTape to control patellar lateral mobility and stimulate VMO for improved patellar tracking. Patient educated on strategies for removal and theory behind using KTape for patellar tracking. Seated hip adduction squeezes at varying degrees of knee flexion for improved VMO control Seated RLE PNF D1 pattern with pain free ROM Standing hip adduction step outs with BTB, RLE Standing hip adduction forward reach with BTB, RLE Reviewed various positions for VMO activation including sidelying, hooklying, seated, and standing.    Patient educated  throughout session on appropriate technique and form using multi-modal cueing, HEP, and activity modification. Patient articulated understanding and returned demonstration.   Patient Response to interventions: Patient notes decreased pain at R knee with KTape and exercises   ASSESSMENT Patient presents to clinic with excellent motivation to participate in therapy. Patient demonstrates deficits in RLE strength, posture, B hip IR pain free motion, pain, lumbar extension AROM, mixed UI. Patient with some notable fatigue of medial RLE mm but able to perform motor control exercises during today's session and responded positively to active interventions. Patient will benefit from continued skilled therapeutic intervention to address remaining deficits in RLE strength, posture, B hip IR pain free motion, pain, lumbar extension AROM, mixed UI in order to increase function, and improve overall QOL.    PT Long Term Goals - 07/22/19 1250      PT LONG TERM GOAL #1   Title  Patient will demonstrate independence with HEP in order to maximize therapeutic gains and improve carryover from physical therapy sessions to ADLs in the home and community.    Baseline  IE: provided    Time  8    Period  Weeks    Status  New    Target Date  09/16/19      PT LONG TERM GOAL #2   Title  Patient will demonstrate improved function as evidenced by a score of  67 on FOTO measure for full participation in activities at home and in the community.    Baseline  IE: 4    Time  8    Period  Weeks    Status  New    Target Date  09/16/19      PT LONG TERM GOAL #3   Title  Patient will decrease worst pain as reported on NPRS by at least 2 points to demonstrate clinically significant reduction in pain in order to restore/improve function and overall QOL.    Baseline  IE: 9/10    Time  8    Period  Weeks    Status  New    Target Date  09/16/19      PT LONG TERM GOAL #4   Title  Patient will demonstrate pain limited ROM for  lumbar extension and B hip IR in order to participate fully in caregiving activities.    Baseline  IE: all painful    Time  8    Period  Weeks    Status  New    Target Date  09/16/19      PT LONG TERM GOAL #5   Title  Patient will improve RLE strength by 1/2 MMT grade in order to demonstrate clinically significant improvement and return to full participation in activities at home and in the community.    Baseline  IE: 4/5    Time  8    Period  Weeks    Status  New    Target Date  09/16/19            Plan - 08/20/19 1220    Clinical Impression Statement  Patient presents to clinic with excellent motivation to participate in therapy. Patient demonstrates deficits in RLE strength, posture, B hip IR pain free motion, pain, lumbar extension AROM, mixed UI. Patient with some notable fatigue of medial RLE mm but able to perform motor control exercises during today's session and responded positively to active interventions. Patient will benefit from continued skilled therapeutic intervention to address remaining deficits in RLE strength, posture, B hip IR pain free motion, pain, lumbar extension AROM, mixed UI in order to increase function, and improve overall QOL.    Personal Factors and Comorbidities  Comorbidity 3+;Past/Current Experience;Time since onset of injury/illness/exacerbation;Sex;Age;Education;Behavior Pattern;Finances    Comorbidities  bipolar depression, ADHD, anxiety, PTSD, colitis    Examination-Activity Limitations  Transfers;Bathing;Dressing;Bed Mobility;Lift;Squat;Sleep;Bend;Stairs;Stand;Reach Overhead    Examination-Participation Restrictions  Personal Finances;Interpersonal Relationship;Yard Work;Laundry;Cleaning;Community Activity    Stability/Clinical Decision Making  Evolving/Moderate complexity    Rehab Potential  Fair    PT Frequency  2x / week    PT Duration  8 weeks    PT Treatment/Interventions  Cryotherapy;Moist Heat;Electrical Stimulation;Therapeutic  activities;Functional mobility training;Stair training;Gait training;Therapeutic exercise;Balance training;Neuromuscular re-education;Patient/family education;Manual techniques;Taping;Splinting;Dry needling;Passive range of motion;Spinal Manipulations;Joint Manipulations;Scar mobilization    PT Next Visit Plan  manual PRN, postural re-education    PT Home Exercise Plan  Access Code: [email or web address]    Consulted and Agree with Plan of Care  Patient       Patient will benefit from skilled therapeutic intervention in order to improve the following deficits and impairments:  Abnormal gait, Decreased endurance, Decreased mobility, Difficulty walking, Increased muscle spasms, Improper body mechanics, Postural dysfunction, Pain, Decreased strength, Decreased coordination, Decreased activity tolerance, Decreased range of motion  Visit Diagnosis: Chronic low back pain without sciatica, unspecified back pain laterality  Muscle weakness (generalized)  Abnormal posture     Problem List Patient  Active Problem List   Diagnosis Date Noted  . Acute bilateral low back pain without sciatica 07/17/2019  . Chronic bilateral low back pain without sciatica 07/17/2019  . Tobacco dependence 07/17/2019  . History of thyroid nodule 07/17/2019  . Bipolar depression ([place]) 07/17/2019  . Binge eating 07/17/2019  . Attention deficit hyperactivity disorder (ADHD) 07/17/2019  . Absolute anemia 07/17/2019  . Ulcerative colitis with rectal bleeding ([place]) 07/17/2019  . Class 2 obesity due to excess calories without serious comorbidity with body mass index (BMI) of 36.0 to 36.9 in adult 07/17/2019   Myles Gip PT, DPT [phone] 08/20/2019, 12:32 PM  [place] [address] [place], [place], [address] Phone: [phone]   Fax:  [phone]  Name: Annette Snyder MRN: [id number] Date of Birth: 12/18/1980

## 2019-08-22 ENCOUNTER — Encounter: Payer: Medicaid Other | Admitting: Physical Therapy

## 2019-08-22 DIAGNOSIS — M2391 Unspecified internal derangement of right knee: Secondary | ICD-10-CM | POA: Diagnosis not present

## 2019-08-22 DIAGNOSIS — M228X1 Other disorders of patella, right knee: Secondary | ICD-10-CM | POA: Diagnosis not present

## 2019-08-29 ENCOUNTER — Encounter: Payer: Self-pay | Admitting: Physical Therapy

## 2019-08-29 ENCOUNTER — Ambulatory Visit: Payer: Medicaid Other | Admitting: Physical Therapy

## 2019-08-29 ENCOUNTER — Other Ambulatory Visit: Payer: Self-pay

## 2019-08-29 DIAGNOSIS — G8929 Other chronic pain: Secondary | ICD-10-CM | POA: Diagnosis not present

## 2019-08-29 DIAGNOSIS — S83281A Other tear of lateral meniscus, current injury, right knee, initial encounter: Secondary | ICD-10-CM | POA: Diagnosis not present

## 2019-08-29 DIAGNOSIS — R293 Abnormal posture: Secondary | ICD-10-CM | POA: Diagnosis not present

## 2019-08-29 DIAGNOSIS — M7631 Iliotibial band syndrome, right leg: Secondary | ICD-10-CM | POA: Diagnosis not present

## 2019-08-29 DIAGNOSIS — M2391 Unspecified internal derangement of right knee: Secondary | ICD-10-CM | POA: Diagnosis not present

## 2019-08-29 DIAGNOSIS — M545 Low back pain, unspecified: Secondary | ICD-10-CM

## 2019-08-29 DIAGNOSIS — M228X1 Other disorders of patella, right knee: Secondary | ICD-10-CM | POA: Diagnosis not present

## 2019-08-29 DIAGNOSIS — M2241 Chondromalacia patellae, right knee: Secondary | ICD-10-CM | POA: Diagnosis not present

## 2019-08-29 DIAGNOSIS — M6281 Muscle weakness (generalized): Secondary | ICD-10-CM | POA: Diagnosis not present

## 2019-08-29 NOTE — Therapy (Signed)
[place] [facility] [address]. [place], [place], [address] Phone: [phone]   Fax:  [phone]  Physical Therapy Treatment  Patient Details  Name: Annette Snyder MRN: [id number] Date of Birth: [birth year] Referring Provider (PT): Lebron Conners   Encounter Date: 08/29/2019  PT End of Session - 08/29/19 1046    Visit Number  9    Number of Visits  16    Date for PT Re-Evaluation  09/16/19    Authorization Type  CAID    Authorization - Visit Number  4    Authorization - Number of Visits  12    PT Start Time  1050    PT Stop Time  1145    PT Time Calculation (min)  55 min    Activity Tolerance  Patient tolerated treatment well    Behavior During Therapy  [facility] for tasks assessed/performed       Past Medical History:  Diagnosis Date  . Anxiety   . Bipolar disorder ([place])   . Chronic back pain   . Colitis   . PTSD (post-traumatic stress disorder)     History reviewed. No pertinent surgical history.  There were no vitals filed for this visit.  Subjective Assessment - 08/29/19 1046    Subjective  Patient notes that she does her stretches in the morning when she wakes up which assists with the stiffness. Patient had her f/u with ortho and they have performed injections. Her orthopedist would like her to see his PT despite her interest in attending PT at this clinic, so she will be discontinuing PT for her back when she starts PT for her R knee.    Currently in Pain?  Yes    Pain Score  1     Pain Location  Back    Pain Orientation  Lower    Multiple Pain Sites  Yes    Pain Score  5    Pain Location  Knee    Pain Orientation  Right    Pain Descriptors / Indicators  Tender      TREATMENT Neuromuscular Re-education: Supine hooklying diaphragmatic breathing with VCs and TCs for downregulation of the nervous system and improved management of IAP Supine modified thomas stretch with diaphragmatic breath for improved hip  flexor mobility and decreased pain, BLE Supine figure 4 stretch to chest with diaphragmatic breathing for improved lumbar mobility and decreased hip pain Standing Pilates Postural Control Hug a Tree, GTB, x10 Serve a Tray, GTB, x10 Shaving/Salute, GTB, x10 Reverse Hug a Tree, GTB, x10 Reverse Serve a Tray, GTB, x10 Reverse Shaving/Salute, GTB, x10  Manual Therapy: STM and TPR performed to B lumbar mm and gluteal mm to allow for decreased tension and pain and improved posture and function with vibratory and percussive instrument   Patient educated throughout session on appropriate technique and form using multi-modal cueing, HEP, and activity modification. Patient articulated understanding and returned demonstration.   Patient Response to interventions: 0/10 back pain at end of session.   ASSESSMENT Patient presents to clinic with excellent motivation to participate in therapy. Patient demonstrates deficits in RLE strength, posture, B hip IR pain free motion, pain, lumbar extension AROM, mixed UI. Patient able to maintain good form with standing postural control activities during today's session and responded positively to manual  interventions combined with pain modulation interventions. Patient will benefit from continued skilled therapeutic intervention to address remaining deficits in RLE strength, posture, B hip IR pain free motion, pain,  lumbar extension AROM, mixed UI in order to increase function, and improve overall QOL.    PT Long Term Goals - 07/22/19 1250      PT LONG TERM GOAL #1   Title  Patient will demonstrate independence with HEP in order to maximize therapeutic gains and improve carryover from physical therapy sessions to ADLs in the home and community.    Baseline  IE: provided    Time  8    Period  Weeks    Status  New    Target Date  09/16/19      PT LONG TERM GOAL #2   Title  Patient will demonstrate improved function as evidenced by a score of 67 on FOTO  measure for full participation in activities at home and in the community.    Baseline  IE: 34    Time  8    Period  Weeks    Status  New    Target Date  09/16/19      PT LONG TERM GOAL #3   Title  Patient will decrease worst pain as reported on NPRS by at least 2 points to demonstrate clinically significant reduction in pain in order to restore/improve function and overall QOL.    Baseline  IE: 9/10    Time  8    Period  Weeks    Status  New    Target Date  09/16/19      PT LONG TERM GOAL #4   Title  Patient will demonstrate pain limited ROM for lumbar extension and B hip IR in order to participate fully in caregiving activities.    Baseline  IE: all painful    Time  8    Period  Weeks    Status  New    Target Date  09/16/19      PT LONG TERM GOAL #5   Title  Patient will improve RLE strength by 1/2 MMT grade in order to demonstrate clinically significant improvement and return to full participation in activities at home and in the community.    Baseline  IE: 4/5    Time  8    Period  Weeks    Status  New    Target Date  09/16/19            Plan - 08/29/19 1046    Clinical Impression Statement  Patient presents to clinic with excellent motivation to participate in therapy. Patient demonstrates deficits in RLE strength, posture, B hip IR pain free motion, pain, lumbar extension AROM, mixed UI. Patient able to maintain good form with standing postural control activities during today's session and responded positively to manual  interventions combined with pain modulation interventions. Patient will benefit from continued skilled therapeutic intervention to address remaining deficits in RLE strength, posture, B hip IR pain free motion, pain, lumbar extension AROM, mixed UI in order to increase function, and improve overall QOL.    Personal Factors and Comorbidities  Comorbidity 3+;Past/Current Experience;Time since onset of injury/illness/exacerbation;Sex;Age;Education;Behavior  Pattern;Finances    Comorbidities  bipolar depression, ADHD, anxiety, PTSD, colitis    Examination-Activity Limitations  Transfers;Bathing;Dressing;Bed Mobility;Lift;Squat;Sleep;Bend;Stairs;Stand;Reach Overhead    Examination-Participation Restrictions  Personal Finances;Interpersonal Relationship;Yard Work;Laundry;Cleaning;Community Activity    Stability/Clinical Decision Making  Evolving/Moderate complexity    Rehab Potential  Fair    PT Frequency  2x / week    PT Duration  8 weeks    PT Treatment/Interventions  Cryotherapy;Moist Heat;Electrical Stimulation;Therapeutic activities;Functional mobility training;Stair training;Gait training;Therapeutic exercise;Balance training;Neuromuscular re-education;Patient/family  education;Manual techniques;Taping;Splinting;Dry needling;Passive range of motion;Spinal Manipulations;Joint Manipulations;Scar mobilization    PT Next Visit Plan  manual PRN, postural re-education    PT Home Exercise Plan  Access Code: [email or web address]    Consulted and Agree with Plan of Care  Patient       Patient will benefit from skilled therapeutic intervention in order to improve the following deficits and impairments:  Abnormal gait, Decreased endurance, Decreased mobility, Difficulty walking, Increased muscle spasms, Improper body mechanics, Postural dysfunction, Pain, Decreased strength, Decreased coordination, Decreased activity tolerance, Decreased range of motion  Visit Diagnosis: Chronic low back pain without sciatica, unspecified back pain laterality  Muscle weakness (generalized)  Abnormal posture     Problem List Patient Active Problem List   Diagnosis Date Noted  . Acute bilateral low back pain without sciatica 07/17/2019  . Chronic bilateral low back pain without sciatica 07/17/2019  . Tobacco dependence 07/17/2019  . History of thyroid nodule 07/17/2019  . Bipolar depression ([place]) 07/17/2019  . Binge eating 07/17/2019  . Attention deficit hyperactivity  disorder (ADHD) 07/17/2019  . Absolute anemia 07/17/2019  . Ulcerative colitis with rectal bleeding ([place]) 07/17/2019  . Class 2 obesity due to excess calories without serious comorbidity with body mass index (BMI) of 36.0 to 36.9 in adult 07/17/2019   Myles Gip PT, DPT [phone]  08/29/2019, 1:07 PM  [place] [facility] [address]. [place], [place], [address] Phone: [phone]   Fax:  [phone]  Name: Annette Snyder MRN: [id number] Date of Birth: 1980-10-23

## 2019-09-03 ENCOUNTER — Encounter: Payer: Self-pay | Admitting: Internal Medicine

## 2019-09-04 ENCOUNTER — Other Ambulatory Visit: Payer: Self-pay

## 2019-09-04 DIAGNOSIS — Z6836 Body mass index (BMI) 36.0-36.9, adult: Secondary | ICD-10-CM

## 2019-09-05 ENCOUNTER — Encounter: Payer: Self-pay | Admitting: Physical Therapy

## 2019-09-05 ENCOUNTER — Ambulatory Visit: Payer: Medicaid Other | Admitting: Physical Therapy

## 2019-09-05 ENCOUNTER — Other Ambulatory Visit: Payer: Self-pay

## 2019-09-05 DIAGNOSIS — R293 Abnormal posture: Secondary | ICD-10-CM | POA: Diagnosis not present

## 2019-09-05 DIAGNOSIS — M6281 Muscle weakness (generalized): Secondary | ICD-10-CM | POA: Diagnosis not present

## 2019-09-05 DIAGNOSIS — G8929 Other chronic pain: Secondary | ICD-10-CM

## 2019-09-05 DIAGNOSIS — M545 Low back pain: Secondary | ICD-10-CM | POA: Diagnosis not present

## 2019-09-05 NOTE — Therapy (Signed)
[place] [facility] [address] [place], [place], [address] Phone: [phone]   Fax:  [phone]  Physical Therapy Treatment/Discharge  Patient Details  Name: Annette Snyder MRN: [id number] Date of Birth: 03-Sep-1980 Referring Provider (PT): Lebron Conners   Encounter Date: 09/05/2019  PT End of Session - 09/05/19 1501    Visit Number  10    Number of Visits  16    Date for PT Re-Evaluation  09/16/19    Authorization Type  CAID    Authorization - Visit Number  5    Authorization - Number of Visits  12    PT Start Time  0175    PT Stop Time  1524    PT Time Calculation (min)  29 min    Activity Tolerance  Patient tolerated treatment well    Behavior During Therapy  [facility] for tasks assessed/performed       Past Medical History:  Diagnosis Date  . Anxiety   . Bipolar disorder ([place])   . Chronic back pain   . Colitis   . PTSD (post-traumatic stress disorder)     History reviewed. No pertinent surgical history.  There were no vitals filed for this visit.  Subjective Assessment - 09/05/19 1458    Subjective  Patient notes that she feels like her back is getting better and she feels comfortable with self-management. In other news, she has been having mesntrual bleeding for ~ 2 weeks and is feeling increasingly fatigued. Notes she did receive her second Covid19 vaccination yesterday, which may be contributing to her fatigue levels.    Currently in Pain?  No/denies       TREATMENT Neuromuscular Re-education: Patient education on comprehensive HEP for progressive core strengthening and stabilization in standing or modified quadruped postures.     Patient educated throughout session on appropriate technique and form using multi-modal cueing, HEP, and activity modification. Patient articulated understanding and returned demonstration.   Patient Response to interventions: Patient confident with self-management for back pain.    ASSESSMENT Patient presents to clinic with excellent motivation to participate in therapy. Patient demonstrates deficits in RLE strength, posture, B hip IR pain free motion, pain, lumbar extension AROM, mixed UI. Patient articulates meaningful progress toward her goals during today's session and responded positively to educational interventions for improved maintenance of gains regarding back pain. Patient is appropriate to discharge to self-management of deficits in posture, pain, lumbar extension AROM, mixed UI in order to maintain increased function, and improve overall QOL.    PT Long Term Goals - 09/05/19 1505      PT LONG TERM GOAL #1   Title  Patient will demonstrate independence with HEP in order to maximize therapeutic gains and improve carryover from physical therapy sessions to ADLs in the home and community.    Baseline  IE: provided; 5/20: IND    Time  8    Period  Weeks    Status  Achieved      PT LONG TERM GOAL #2   Title  Patient will demonstrate improved function as evidenced by a score of 67 on FOTO measure for full participation in activities at home and in the community.    Baseline  IE: 63; 4/27: 65    Time  8    Period  Weeks    Status  Not Met      PT LONG TERM GOAL #3   Title  Patient will decrease worst pain as reported on  NPRS by at least 2 points to demonstrate clinically significant reduction in pain in order to restore/improve function and overall QOL.    Baseline  IE: 9/10; 5/20: 4/10    Time  8    Period  Weeks    Status  Achieved      PT LONG TERM GOAL #4   Title  Patient will demonstrate pain limited ROM for lumbar extension and B hip IR in order to participate fully in caregiving activities.    Baseline  IE: all painful; 5/20: non-painful after stretches    Time  8    Period  Weeks    Status  Achieved      PT LONG TERM GOAL #5   Title  Patient will improve RLE strength by 1/2 MMT grade in order to demonstrate clinically significant improvement  and return to full participation in activities at home and in the community.    Baseline  IE: 4/5    Time  8    Period  Weeks    Status  Deferred            Plan - 09/05/19 1503    Clinical Impression Statement  Patient presents to clinic with excellent motivation to participate in therapy. Patient demonstrates deficits in RLE strength, posture, B hip IR pain free motion, pain, lumbar extension AROM, mixed UI. Patient articulates meaningful progress toward her goals during today's session and responded positively to educational interventions for improved maintenance of gains regarding back pain. Patient is appropriate to discharge to self-management of deficits in posture, pain, lumbar extension AROM, mixed UI in order to maintain increased function, and improve overall QOL.    Personal Factors and Comorbidities  Comorbidity 3+;Past/Current Experience;Time since onset of injury/illness/exacerbation;Sex;Age;Education;Behavior Pattern;Finances    Comorbidities  bipolar depression, ADHD, anxiety, PTSD, colitis    Examination-Activity Limitations  Transfers;Bathing;Dressing;Bed Mobility;Lift;Squat;Sleep;Bend;Stairs;Stand;Reach Overhead    Examination-Participation Restrictions  Personal Finances;Interpersonal Relationship;Yard Work;Laundry;Cleaning;Community Activity    Stability/Clinical Decision Making  Evolving/Moderate complexity    Rehab Potential  Fair    PT Frequency  2x / week    PT Duration  8 weeks    PT Treatment/Interventions  Cryotherapy;Moist Heat;Electrical Stimulation;Therapeutic activities;Functional mobility training;Stair training;Gait training;Therapeutic exercise;Balance training;Neuromuscular re-education;Patient/family education;Manual techniques;Taping;Splinting;Dry needling;Passive range of motion;Spinal Manipulations;Joint Manipulations;Scar mobilization    PT Next Visit Plan  manual PRN, postural re-education    PT Home Exercise Plan  Access Code: [email or web address]     Consulted and Agree with Plan of Care  Patient       Patient will benefit from skilled therapeutic intervention in order to improve the following deficits and impairments:  Abnormal gait, Decreased endurance, Decreased mobility, Difficulty walking, Increased muscle spasms, Improper body mechanics, Postural dysfunction, Pain, Decreased strength, Decreased coordination, Decreased activity tolerance, Decreased range of motion  Visit Diagnosis: Chronic low back pain without sciatica, unspecified back pain laterality  Muscle weakness (generalized)  Abnormal posture     Problem List Patient Active Problem List   Diagnosis Date Noted  . Acute bilateral low back pain without sciatica 07/17/2019  . Chronic bilateral low back pain without sciatica 07/17/2019  . Tobacco dependence 07/17/2019  . History of thyroid nodule 07/17/2019  . Bipolar depression ([place]) 07/17/2019  . Binge eating 07/17/2019  . Attention deficit hyperactivity disorder (ADHD) 07/17/2019  . Absolute anemia 07/17/2019  . Ulcerative colitis with rectal bleeding ([place]) 07/17/2019  . Class 2 obesity due to excess calories without serious comorbidity with body mass index (BMI) of 36.0 to 36.9 in  adult 07/17/2019   Myles Gip PT, DPT [phone] 09/05/2019, 3:31 PM  [place] [facility] [address] [place], [place], [address] Phone: [phone]   Fax:  [phone]  Name: Annette Snyder MRN: [id number] Date of Birth: September 27, 1980

## 2019-09-09 ENCOUNTER — Encounter: Payer: Self-pay | Admitting: Internal Medicine

## 2019-09-10 DIAGNOSIS — R102 Pelvic and perineal pain: Secondary | ICD-10-CM | POA: Diagnosis not present

## 2019-09-10 DIAGNOSIS — N939 Abnormal uterine and vaginal bleeding, unspecified: Secondary | ICD-10-CM | POA: Diagnosis not present

## 2019-09-10 DIAGNOSIS — D251 Intramural leiomyoma of uterus: Secondary | ICD-10-CM | POA: Diagnosis not present

## 2019-09-10 DIAGNOSIS — B9689 Other specified bacterial agents as the cause of diseases classified elsewhere: Secondary | ICD-10-CM | POA: Diagnosis not present

## 2019-09-10 DIAGNOSIS — N760 Acute vaginitis: Secondary | ICD-10-CM | POA: Diagnosis not present

## 2019-09-10 DIAGNOSIS — D259 Leiomyoma of uterus, unspecified: Secondary | ICD-10-CM | POA: Diagnosis not present

## 2019-09-12 ENCOUNTER — Encounter: Payer: Self-pay | Admitting: Physical Therapy

## 2019-09-13 DIAGNOSIS — H5203 Hypermetropia, bilateral: Secondary | ICD-10-CM | POA: Diagnosis not present

## 2019-09-20 DIAGNOSIS — N939 Abnormal uterine and vaginal bleeding, unspecified: Secondary | ICD-10-CM | POA: Diagnosis not present

## 2019-09-20 DIAGNOSIS — D259 Leiomyoma of uterus, unspecified: Secondary | ICD-10-CM | POA: Diagnosis not present

## 2019-09-24 NOTE — Progress Notes (Deleted)
Patient is a 39 year old female presented to establish with the practice 07/17/2019 and had a follow-up visit 08/14/2019 via telemedicine. She follows up today.   1. Tobacco dependence Has had some success reducing the number of cigarettes, and had prior contact with the [facility] tobacco cessation program for recommendations as well.   A nicotine patch was felt to be a good next step, and discussed this with her.  will use the 21 mg patch for 4 weeks, and then reduce to 14 mg and then 7 mg over time before stopping, in hopes of reaching complete tobacco cessation. She will contact us in about 4 weeks, and update [place], and if helpful, will lessen to the 14 mg patch at that point.  Did note these are available over-the-counter, although a prescription was requested and 1 was sent to her pharmacy. Did note if we are not having success with the nicotine supplements, will follow up to entertain other options including a potential medicine that can help as well.  She was in agreement with this - nicotine (NICODERM CQ - DOSED IN MG/24 HOURS) 21 mg/24hr patch; Place 1 patch (21 mg total) onto the skin daily.  Dispense: 28 patch; Refill: 0  2. Acute bilateral low back pain without sciatica Has noted some help with physical therapy, and doing the exercises and stretches at home as recommended.  Emphasized the importance of the home program component, and doing exercises on a daily basis. Continue with PT presently.  3. Chronic pain of right knee, acute on chronic recent past She saw [facility] on 09/06/2019 with the following assessment/plan: 1. 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 We had a good discussion with Mikiala in clinic today. She does have primarily anterior knee pain and MRI showing chondral loss at both the backside of the patella and in the trochlear groove. She also has tenderness throughout her knee, particularly over the lateral soft tissues and the patellar tendon. She has  already done physical therapy and a course of oral NSAIDs. We recommend a corticosteroid injection. She would likely benefit from both intra-articular steroid injection as well as extra-articular into the lateral soft tissues, possibly the lateral retinaculum. We would like her to begin working with physical therapy approximately 1 week after her injection. We do not recommend any surgery as she does not have any signs of patellar instability and her patellar tracking appears normal on both imaging and exam. She is in agreement with this and all of her questions were answered to her satisfaction. Follow-up will be on an as-needed basis. She has been seeing physical therapy  Obesity -patient requested a referral to [facility] bariatric surgery on 09/03/2019 as she noted has been going to [facility] orthopedics for her right knee as well as the issue with my back and she was told that she needed to get her weight down and has had several attempts trying to lose weight and failed.  One was provided.  She followed up with OB/GYN on 09/20/2019 after she was seen in the emergency room in late May for abnormal uterine bleeding.  She had an endometrial biopsy done on that visit and a Mirena IUD placed, and was to follow-up in approximately 6 weeks time after.  Other problems being monitored/treated included:  Prediabetes:Currently untreated with medications. Initial hemoglobin A[facility]c 5.7%. [facility]  [facility] and [facility] noted [facility] continues. Currentmedications -Lexapro, Buspar, and abilify presently, gets her Abilify shot monthly.  She  notesmeds helpful last visit.  Binge eating and ADHD:prior treatment with Vyvanse. Discontinued previously due to outside provider's lack of ability to prescribe controlled substances. Currentlytaking Vyvanse 50 mg.She will continue to have those providers write the prescription for her and provide.  Anemia  history: 05/08/2019 - hgb 11.7with low red blood cell indices,prior CBCsfrom 2018 and priorreviewed,noted she wasmore anemic over that time Iron paneldone 05/08/19-had a low iron level, low ferritin, low iron saturation- 26, 6.5 and 6 respectively,vitamin B12 level was normal Just saw hematologist a week prior to our last visit, had iron infusion at [place] (Iron pills make constipated and not good for her Geralynn Rile gets infusions). She continues to follow at the [place].  Ulcerative colitisDx'ed with scope, 2017, most recent scope in Feb, followed by GI'ist Takes mesalamine supp's to manage, also Linzess.  Thyroid nodule history-she noted she had a thyroid nodule discovered when she was in [place], had a biopsy done, and was told was benign. Has been followed at the [place] after.

## 2019-09-25 ENCOUNTER — Ambulatory Visit: Payer: Medicaid Other | Admitting: Internal Medicine

## 2019-10-07 DIAGNOSIS — G8929 Other chronic pain: Secondary | ICD-10-CM | POA: Diagnosis not present

## 2019-10-07 DIAGNOSIS — R262 Difficulty in walking, not elsewhere classified: Secondary | ICD-10-CM | POA: Diagnosis not present

## 2019-10-07 DIAGNOSIS — M25561 Pain in right knee: Secondary | ICD-10-CM | POA: Diagnosis not present

## 2019-10-08 DIAGNOSIS — R7303 Prediabetes: Secondary | ICD-10-CM | POA: Diagnosis not present

## 2019-10-08 DIAGNOSIS — E6609 Other obesity due to excess calories: Secondary | ICD-10-CM | POA: Diagnosis not present

## 2019-10-08 DIAGNOSIS — Z6834 Body mass index (BMI) 34.0-34.9, adult: Secondary | ICD-10-CM | POA: Diagnosis not present

## 2019-10-08 DIAGNOSIS — F5081 Binge eating disorder: Secondary | ICD-10-CM | POA: Diagnosis not present

## 2019-10-09 DIAGNOSIS — M2241 Chondromalacia patellae, right knee: Secondary | ICD-10-CM | POA: Diagnosis not present

## 2019-10-14 DIAGNOSIS — M2241 Chondromalacia patellae, right knee: Secondary | ICD-10-CM | POA: Diagnosis not present

## 2019-10-30 DIAGNOSIS — Z9884 Bariatric surgery status: Secondary | ICD-10-CM | POA: Diagnosis not present

## 2019-11-15 DIAGNOSIS — S83281A Other tear of lateral meniscus, current injury, right knee, initial encounter: Secondary | ICD-10-CM | POA: Diagnosis not present

## 2019-11-15 DIAGNOSIS — M94261 Chondromalacia, right knee: Secondary | ICD-10-CM | POA: Diagnosis not present

## 2019-11-15 DIAGNOSIS — M2241 Chondromalacia patellae, right knee: Secondary | ICD-10-CM | POA: Diagnosis not present

## 2020-02-03 DIAGNOSIS — F430 Acute stress reaction: Secondary | ICD-10-CM | POA: Diagnosis not present

## 2020-02-12 DIAGNOSIS — R079 Chest pain, unspecified: Secondary | ICD-10-CM | POA: Diagnosis not present

## 2020-02-12 DIAGNOSIS — M791 Myalgia, unspecified site: Secondary | ICD-10-CM | POA: Diagnosis not present

## 2020-02-12 DIAGNOSIS — R0789 Other chest pain: Secondary | ICD-10-CM | POA: Diagnosis not present

## 2020-02-12 DIAGNOSIS — Z5321 Procedure and treatment not carried out due to patient leaving prior to being seen by health care provider: Secondary | ICD-10-CM | POA: Diagnosis not present

## 2020-02-12 DIAGNOSIS — R062 Wheezing: Secondary | ICD-10-CM | POA: Diagnosis not present

## 2020-02-12 DIAGNOSIS — R059 Cough, unspecified: Secondary | ICD-10-CM | POA: Diagnosis not present

## 2020-02-12 DIAGNOSIS — R519 Headache, unspecified: Secondary | ICD-10-CM | POA: Diagnosis not present

## 2020-02-14 ENCOUNTER — Other Ambulatory Visit: Payer: Self-pay

## 2020-02-14 ENCOUNTER — Emergency Department (HOSPITAL_COMMUNITY)
Admission: EM | Admit: 2020-02-14 | Discharge: 2020-02-14 | Disposition: A | Discharge status: Home / Self Care | Payer: Medicaid Other | Attending: Emergency Medicine | Admitting: Emergency Medicine

## 2020-02-14 ENCOUNTER — Encounter (HOSPITAL_COMMUNITY): Payer: Self-pay | Admitting: Emergency Medicine

## 2020-02-14 ENCOUNTER — Emergency Department (HOSPITAL_COMMUNITY): Payer: Medicaid Other

## 2020-02-14 DIAGNOSIS — T50995A Adverse effect of other drugs, medicaments and biological substances, initial encounter: Secondary | ICD-10-CM | POA: Diagnosis not present

## 2020-02-14 DIAGNOSIS — R059 Cough, unspecified: Secondary | ICD-10-CM | POA: Insufficient documentation

## 2020-02-14 DIAGNOSIS — T887XXA Unspecified adverse effect of drug or medicament, initial encounter: Secondary | ICD-10-CM

## 2020-02-14 DIAGNOSIS — R058 Other specified cough: Secondary | ICD-10-CM | POA: Diagnosis not present

## 2020-02-14 DIAGNOSIS — F17210 Nicotine dependence, cigarettes, uncomplicated: Secondary | ICD-10-CM | POA: Insufficient documentation

## 2020-02-14 DIAGNOSIS — R21 Rash and other nonspecific skin eruption: Secondary | ICD-10-CM | POA: Diagnosis not present

## 2020-02-14 DIAGNOSIS — R519 Headache, unspecified: Secondary | ICD-10-CM | POA: Diagnosis not present

## 2020-02-14 DIAGNOSIS — R0981 Nasal congestion: Secondary | ICD-10-CM | POA: Insufficient documentation

## 2020-02-14 DIAGNOSIS — R062 Wheezing: Secondary | ICD-10-CM | POA: Insufficient documentation

## 2020-02-14 LAB — CBC WITH DIFFERENTIAL/PLATELET
Abs Immature Granulocytes: 0.01 10*3/uL (ref 0.00–0.07)
Basophils Absolute: 0 10*3/uL (ref 0.0–0.1)
Basophils Relative: 1 %
Eosinophils Absolute: 0.2 10*3/uL (ref 0.0–0.5)
Eosinophils Relative: 4 %
HCT: 44.8 % (ref 36.0–46.0)
Hemoglobin: 14.6 g/dL (ref 12.0–15.0)
Immature Granulocytes: 0 %
Lymphocytes Relative: 29 %
Lymphs Abs: 1.5 10*3/uL (ref 0.7–4.0)
MCH: 30.5 pg (ref 26.0–34.0)
MCHC: 32.6 g/dL (ref 30.0–36.0)
MCV: 93.5 fL (ref 80.0–100.0)
Monocytes Absolute: 0.4 10*3/uL (ref 0.1–1.0)
Monocytes Relative: 9 %
Neutro Abs: 2.9 10*3/uL (ref 1.7–7.7)
Neutrophils Relative %: 57 %
Platelets: 310 10*3/uL (ref 150–400)
RBC: 4.79 MIL/uL (ref 3.87–5.11)
RDW: 13.6 % (ref 11.5–15.5)
WBC: 5 10*3/uL (ref 4.0–10.5)
nRBC: 0 % (ref 0.0–0.2)

## 2020-02-14 LAB — COMPREHENSIVE METABOLIC PANEL
ALT: 17 U/L (ref 0–44)
AST: 20 U/L (ref 15–41)
Albumin: 4.1 g/dL (ref 3.5–5.0)
Alkaline Phosphatase: 61 U/L (ref 38–126)
Anion gap: 9 (ref 5–15)
BUN: 6 mg/dL (ref 6–20)
CO2: 26 mmol/L (ref 22–32)
Calcium: 9.3 mg/dL (ref 8.9–10.3)
Chloride: 104 mmol/L (ref 98–111)
Creatinine, Ser: 0.75 mg/dL (ref 0.44–1.00)
GFR, Estimated: >60 mL/min (ref >60)
Glucose, Bld: 85 mg/dL (ref 70–99)
Potassium: 3.7 mmol/L (ref 3.5–5.1)
Sodium: 139 mmol/L (ref 135–145)
Total Bilirubin: 0.4 mg/dL (ref 0.3–1.2)
Total Protein: 8.1 g/dL (ref 6.5–8.1)

## 2020-02-14 MED ORDER — ACETAMINOPHEN 500 MG PO TABS
1000.0000 mg | ORAL_TABLET | Freq: Once | ORAL | Status: AC
  Administered 2020-02-14: 1000 mg via ORAL
  Filled 2020-02-14: qty 2

## 2020-02-14 NOTE — ED Triage Notes (Signed)
Got an infusion of Entyvio IV for Ulcerative Colitis on 10/22, her first dose. Since then she has had cough, wheezing, body aches, joint pain, cough, headache, runny nose. Got COVID-19 tested yesterday at a [facility] ED and was negative. Denies N/V/D or fevers.

## 2020-02-14 NOTE — ED Notes (Signed)
Placed cool cloth on patients head for headache/ warm blankets for patients cold body

## 2020-02-14 NOTE — Discharge Instructions (Addendum)
Symptoms seem to be consistent side effects with the medication you were given for your ulcerative colitis.  Recommend symptomatic treatment Tylenol as needed for headaches and for pain.  For the cough can take over-the-counter cough medicine.  Make an appointment follow-up with your gastroenterologist at the [facility] clinic.

## 2020-02-14 NOTE — ED Provider Notes (Signed)
[place] DEPT Provider Note   CSN: [id number] Arrival date & time: 02/14/20  1317     History Chief Complaint  Patient presents with  . Medication Reaction    Annette Snyder is a 39 y.o. female.  Patient followed by the [facility].  She received and Entyvio IV for the first time on October 22 for her ulcerative colitis.  Since then she has had cough wheezing body aches joint pain headache runny nose.  Patient was seen at the [facility] emergency department had Covid testing RSV and influenza testing that was negative.  The patient did not wait to get seen this was yesterday.        Past Medical History:  Diagnosis Date  . Anxiety   . Bipolar disorder ([place])   . Chronic back pain   . Colitis   . PTSD (post-traumatic stress disorder)     Patient Active Problem List   Diagnosis Date Noted  . Acute bilateral low back pain without sciatica 07/17/2019  . Chronic bilateral low back pain without sciatica 07/17/2019  . Tobacco dependence 07/17/2019  . History of thyroid nodule 07/17/2019  . Bipolar depression ([place]) 07/17/2019  . Binge eating 07/17/2019  . Attention deficit hyperactivity disorder (ADHD) 07/17/2019  . Absolute anemia 07/17/2019  . Ulcerative colitis with rectal bleeding ([place]) 07/17/2019  . Class 2 obesity due to excess calories without serious comorbidity with body mass index (BMI) of 36.0 to 36.9 in adult 07/17/2019    History reviewed. No pertinent surgical history.   OB History   No obstetric history on file.     Family History  Problem Relation Age of Onset  . Diabetes Mother   . Glaucoma Mother   . Hypertension Mother   . Blindness Mother   . Hypertension Father   . Stroke Father   . Bipolar disorder Brother   . Crohn's disease Brother   . Depression Maternal Grandmother   . Depression Maternal Grandfather   . Bipolar disorder Brother     Social History   Tobacco Use  . Smoking status: Current  Every Day Smoker    Types: Cigarettes  . Smokeless tobacco: Never Used  Vaping Use  . Vaping Use: Never used  Substance Use Topics  . Alcohol use: No  . Drug use: No    Home Medications Prior to Admission medications   Medication Sig Start Date End Date Taking? Authorizing Provider  ARIPiprazole ER (ABILIFY MAINTENA) 300 MG SRER injection Inject 300 mg into the muscle every 28 (twenty-eight) days.    [provider]  busPIRone (BUSPAR) 15 MG tablet Take 15 mg by mouth 3 (three) times daily.    [provider]  Cetirizine HCl 10 MG CAPS Take by mouth.    [provider]  cyclobenzaprine (FLEXERIL) 5 MG tablet Take 1 tablet (5 mg total) by mouth at bedtime. May increase to two tabs at bedtime pending response 07/17/19   Towanda Malkin, MD  escitalopram (LEXAPRO) 10 MG tablet Take 10 mg by mouth daily.    [provider]  lisdexamfetamine (VYVANSE) 20 MG capsule Take 20 mg by mouth daily.    [provider]  meloxicam (MOBIC) 15 MG tablet Take 1 tablet (15 mg total) by mouth daily. 07/17/19   Towanda Malkin, MD  nicotine (NICODERM CQ - DOSED IN MG/24 HOURS) 21 mg/24hr patch Place 1 patch (21 mg total) onto the skin daily. 08/14/19   Towanda Malkin, MD  Allergies    Flagyl [metronidazole] and Seroquel [quetiapine]  Review of Systems   Review of Systems  Constitutional: Negative for chills and fever.  HENT: Negative for rhinorrhea and sore throat.   Eyes: Negative for visual disturbance.  Respiratory: Positive for cough. Negative for shortness of breath.   Cardiovascular: Negative for chest pain and leg swelling.  Gastrointestinal: Negative for abdominal pain, diarrhea, nausea and vomiting.  Genitourinary: Negative for dysuria.  Musculoskeletal: Positive for myalgias. Negative for back pain and neck pain.  Skin: Negative for rash.  Neurological: Positive for headaches. Negative for dizziness and light-headedness.   Hematological: Does not bruise/bleed easily.  Psychiatric/Behavioral: Negative for confusion.    Physical Exam Updated Vital Signs BP 125/81 (BP Location: Left Arm)   Pulse 77   Temp 98.7 F (37.1 C) (Oral)   Resp 20   Ht 1.575 m (5' 2" )   Wt 87.5 kg   LMP 02/11/2020   SpO2 96%   BMI 35.30 kg/m   Physical Exam Vitals and nursing note reviewed.  Constitutional:      General: She is not in acute distress.    Appearance: She is well-developed.  HENT:     Head: Normocephalic and atraumatic.  Eyes:     Extraocular Movements: Extraocular movements intact.     Conjunctiva/sclera: Conjunctivae normal.     Pupils: Pupils are equal, round, and reactive to light.  Cardiovascular:     Rate and Rhythm: Normal rate and regular rhythm.     Heart sounds: No murmur heard.   Pulmonary:     Effort: Pulmonary effort is normal. No respiratory distress.     Breath sounds: Normal breath sounds.  Abdominal:     Palpations: Abdomen is soft.     Tenderness: There is no abdominal tenderness.  Musculoskeletal:     Cervical back: Neck supple. No rigidity or tenderness.  Skin:    General: Skin is warm and dry.     Capillary Refill: Capillary refill takes less than 2 seconds.  Neurological:     General: No focal deficit present.     Mental Status: She is alert and oriented to person, place, and time.     Cranial Nerves: No cranial nerve deficit.     Sensory: No sensory deficit.     Motor: No weakness.     ED Results / Procedures / Treatments   Labs (all labs ordered are listed, but only abnormal results are displayed) Labs Reviewed  CBC WITH DIFFERENTIAL/PLATELET  COMPREHENSIVE METABOLIC PANEL    EKG None  Radiology DG Chest 2 View  Result Date: 02/14/2020 CLINICAL DATA:  39 year old female with cough. EXAM: CHEST - 2 VIEW COMPARISON:  Chest radiograph dated 03/24/2016. FINDINGS: The heart size and mediastinal contours are within normal limits. Both lungs are clear. The  visualized skeletal structures are unremarkable. IMPRESSION: No active cardiopulmonary disease. Electronically Signed   By: Anner Crete M.D.   On: 02/14/2020 15:23    Procedures Procedures (including critical care time)  Medications Ordered in ED Medications  acetaminophen (TYLENOL) tablet 1,000 mg (1,000 mg Oral Given 02/14/20 1721)    ED Course  I have reviewed the triage vital signs and the nursing notes.  Pertinent labs & imaging results that were available during my care of the patient were reviewed by me and considered in my medical decision making (see chart for details).    MDM Rules/Calculators/A&P  Patient nontoxic no acute distress.  No fever here.  Oxygen saturation 95%.   Suspect patient's symptoms are related to side effects from her IV medication.  Did review the side effect profile seems to fit pretty closely.  Symptomatic treatment.  Patient had negative Covid testing flu testing and RSV testing yesterday.  We will have patient follow back up with her gastroenterologist at the [facility].  She will return for any new or worse symptoms. Final Clinical Impression(s) / ED Diagnoses Final diagnoses:  Medication side effect    Rx / DC Orders ED Discharge Orders    None       Fredia Sorrow, MD 02/14/20 [phone]

## 2020-02-17 ENCOUNTER — Telehealth: Payer: Self-pay

## 2020-02-17 NOTE — Telephone Encounter (Signed)
Transition Care Management Follow-up Telephone Call  Date of discharge and from where: 02/14/2020 from [place]  How have you been since you were released from the hospital? Patient is doing well.   Any questions or concerns? No  Items Reviewed:  Did the pt receive and understand the discharge instructions provided? Yes   Medications obtained and verified? Yes   Other? No   Any new allergies since your discharge? No   Dietary orders reviewed? Yes  Do you have support at home? Yes   Functional Questionnaire: (I = Independent and D = Dependent) ADLs: I  Bathing/Dressing- I  Meal Prep- I  Eating- I  Maintaining continence- I  Transferring/Ambulation- I  Managing Meds- I   Follow up appointments reviewed:   PCP Hospital f/u appt confirmed? Patient has a follow up appointment with [facility]. She is followed by [facility] and Dr. Blase Mess.   Are transportation arrangements needed? No   If their condition worsens, is the pt aware to call PCP or go to the Emergency Dept.? YES  Was the patient provided with contact information for the PCP's office or ED? YES  Was to pt encouraged to call back with questions or concerns? YES

## 2020-02-26 DIAGNOSIS — J322 Chronic ethmoidal sinusitis: Secondary | ICD-10-CM | POA: Diagnosis not present

## 2020-02-26 DIAGNOSIS — Y92410 Unspecified street and highway as the place of occurrence of the external cause: Secondary | ICD-10-CM | POA: Diagnosis not present

## 2020-02-26 DIAGNOSIS — J0130 Acute sphenoidal sinusitis, unspecified: Secondary | ICD-10-CM | POA: Diagnosis not present

## 2020-02-26 DIAGNOSIS — M25461 Effusion, right knee: Secondary | ICD-10-CM | POA: Diagnosis not present

## 2020-02-26 DIAGNOSIS — J0120 Acute ethmoidal sinusitis, unspecified: Secondary | ICD-10-CM | POA: Diagnosis not present

## 2020-02-26 DIAGNOSIS — Y999 Unspecified external cause status: Secondary | ICD-10-CM | POA: Diagnosis not present

## 2020-02-26 DIAGNOSIS — M25561 Pain in right knee: Secondary | ICD-10-CM | POA: Diagnosis not present

## 2020-02-26 DIAGNOSIS — M542 Cervicalgia: Secondary | ICD-10-CM | POA: Diagnosis not present

## 2020-02-26 DIAGNOSIS — R2241 Localized swelling, mass and lump, right lower limb: Secondary | ICD-10-CM | POA: Diagnosis not present

## 2020-02-26 DIAGNOSIS — M546 Pain in thoracic spine: Secondary | ICD-10-CM | POA: Diagnosis not present

## 2020-02-26 DIAGNOSIS — R519 Headache, unspecified: Secondary | ICD-10-CM | POA: Diagnosis not present

## 2020-04-29 DIAGNOSIS — F900 Attention-deficit hyperactivity disorder, predominantly inattentive type: Secondary | ICD-10-CM | POA: Diagnosis not present

## 2020-04-29 DIAGNOSIS — Z7982 Long term (current) use of aspirin: Secondary | ICD-10-CM | POA: Diagnosis not present

## 2020-04-29 DIAGNOSIS — F418 Other specified anxiety disorders: Secondary | ICD-10-CM | POA: Diagnosis not present

## 2020-04-29 DIAGNOSIS — Z87891 Personal history of nicotine dependence: Secondary | ICD-10-CM | POA: Diagnosis not present

## 2020-04-29 DIAGNOSIS — R109 Unspecified abdominal pain: Secondary | ICD-10-CM | POA: Diagnosis not present

## 2020-04-29 DIAGNOSIS — K5190 Ulcerative colitis, unspecified, without complications: Secondary | ICD-10-CM | POA: Diagnosis not present

## 2020-04-29 DIAGNOSIS — Z79899 Other long term (current) drug therapy: Secondary | ICD-10-CM | POA: Diagnosis not present

## 2020-04-29 DIAGNOSIS — K449 Diaphragmatic hernia without obstruction or gangrene: Secondary | ICD-10-CM | POA: Diagnosis not present

## 2020-04-29 DIAGNOSIS — K644 Residual hemorrhoidal skin tags: Secondary | ICD-10-CM | POA: Diagnosis not present

## 2020-04-29 DIAGNOSIS — R1084 Generalized abdominal pain: Secondary | ICD-10-CM | POA: Diagnosis not present

## 2020-04-29 DIAGNOSIS — Z888 Allergy status to other drugs, medicaments and biological substances status: Secondary | ICD-10-CM | POA: Diagnosis not present

## 2020-04-29 DIAGNOSIS — D509 Iron deficiency anemia, unspecified: Secondary | ICD-10-CM | POA: Diagnosis not present

## 2020-04-29 DIAGNOSIS — F4310 Post-traumatic stress disorder, unspecified: Secondary | ICD-10-CM | POA: Diagnosis not present

## 2020-04-29 DIAGNOSIS — F32A Depression, unspecified: Secondary | ICD-10-CM | POA: Diagnosis not present

## 2020-08-02 DIAGNOSIS — K5190 Ulcerative colitis, unspecified, without complications: Secondary | ICD-10-CM | POA: Diagnosis not present

## 2020-08-02 DIAGNOSIS — Z7982 Long term (current) use of aspirin: Secondary | ICD-10-CM | POA: Diagnosis not present

## 2020-08-02 DIAGNOSIS — R1032 Left lower quadrant pain: Secondary | ICD-10-CM | POA: Diagnosis not present

## 2020-08-02 DIAGNOSIS — F4310 Post-traumatic stress disorder, unspecified: Secondary | ICD-10-CM | POA: Diagnosis not present

## 2020-08-02 DIAGNOSIS — K625 Hemorrhage of anus and rectum: Secondary | ICD-10-CM | POA: Diagnosis not present

## 2020-08-02 DIAGNOSIS — K644 Residual hemorrhoidal skin tags: Secondary | ICD-10-CM | POA: Diagnosis not present

## 2020-08-02 DIAGNOSIS — R110 Nausea: Secondary | ICD-10-CM | POA: Diagnosis not present

## 2020-08-02 DIAGNOSIS — Z888 Allergy status to other drugs, medicaments and biological substances status: Secondary | ICD-10-CM | POA: Diagnosis not present

## 2020-08-02 DIAGNOSIS — R197 Diarrhea, unspecified: Secondary | ICD-10-CM | POA: Diagnosis not present

## 2020-08-02 DIAGNOSIS — R1013 Epigastric pain: Secondary | ICD-10-CM | POA: Diagnosis not present

## 2020-08-02 DIAGNOSIS — F32A Depression, unspecified: Secondary | ICD-10-CM | POA: Diagnosis not present

## 2020-08-02 DIAGNOSIS — F900 Attention-deficit hyperactivity disorder, predominantly inattentive type: Secondary | ICD-10-CM | POA: Diagnosis not present

## 2020-08-02 DIAGNOSIS — Z79899 Other long term (current) drug therapy: Secondary | ICD-10-CM | POA: Diagnosis not present

## 2020-08-02 DIAGNOSIS — Z87891 Personal history of nicotine dependence: Secondary | ICD-10-CM | POA: Diagnosis not present

## 2021-12-25 ENCOUNTER — Other Ambulatory Visit: Payer: Self-pay

## 2021-12-25 ENCOUNTER — Emergency Department
Admission: EM | Admit: 2021-12-25 | Discharge: 2021-12-25 | Disposition: A | Discharge status: Home / Self Care | Payer: Medicaid Other | Attending: Emergency Medicine | Admitting: Emergency Medicine

## 2021-12-25 ENCOUNTER — Encounter: Payer: Self-pay | Admitting: Emergency Medicine

## 2021-12-25 DIAGNOSIS — F17200 Nicotine dependence, unspecified, uncomplicated: Secondary | ICD-10-CM | POA: Insufficient documentation

## 2021-12-25 DIAGNOSIS — T63441A Toxic effect of venom of bees, accidental (unintentional), initial encounter: Secondary | ICD-10-CM | POA: Diagnosis not present

## 2021-12-25 MED ORDER — DIPHENHYDRAMINE HCL 25 MG PO CAPS
25.0000 mg | ORAL_CAPSULE | Freq: Once | ORAL | Status: AC
  Administered 2021-12-25: 25 mg via ORAL
  Filled 2021-12-25: qty 1

## 2021-12-25 MED ORDER — DIPHENHYDRAMINE HCL 50 MG/ML IJ SOLN: 25.0000 mg | Freq: Once | INTRAMUSCULAR | Status: DC

## 2021-12-25 MED ORDER — IBUPROFEN 400 MG PO TABS
400.0000 mg | ORAL_TABLET | Freq: Once | ORAL | Status: AC
  Administered 2021-12-25: 400 mg via ORAL
  Filled 2021-12-25: qty 1

## 2021-12-25 NOTE — Discharge Instructions (Addendum)
You can apply cold compresses to the face.  You can also take ibuprofen and Benadryl for pain and itching.  This should start to improve over the next day or so.  If you have increasing swelling worsening pain or redness please return to the emergency department.

## 2021-12-25 NOTE — ED Provider Notes (Signed)
[facility] Provider Note    Event Date/Time   First MD Initiated Contact with Patient 12/25/21 1233     (approximate)   History   Insect Bite   HPI  Annette Snyder is a 41 y.o. female past medical history of anxiety bipolar disorder presents with a bee sting to the right side of her face.  Patient was stung last night of her face.  She has had worsening pain and swelling since.  She denies throat closure or difficulty breathing.  No history of allergies to bees.      Past Medical History:  Diagnosis Date   Anxiety    Bipolar disorder ([place])    Chronic back pain    Colitis    PTSD (post-traumatic stress disorder)     Patient Active Problem List   Diagnosis Date Noted   Acute bilateral low back pain without sciatica 07/17/2019   Chronic bilateral low back pain without sciatica 07/17/2019   Tobacco dependence 07/17/2019   History of thyroid nodule 07/17/2019   Bipolar depression ([place]) 07/17/2019   Binge eating 07/17/2019   Attention deficit hyperactivity disorder (ADHD) 07/17/2019   Absolute anemia 07/17/2019   Ulcerative colitis with rectal bleeding ([place]) 07/17/2019   Class 2 obesity due to excess calories without serious comorbidity with body mass index (BMI) of 36.0 to 36.9 in adult 07/17/2019     Physical Exam  Triage Vital Signs: ED Triage Vitals  Enc Vitals Group     BP 12/25/21 1234 (!) 99/53     Pulse Rate 12/25/21 1234 79     Resp 12/25/21 1234 16     Temp 12/25/21 1234 98.6 F (37 C)     Temp Source 12/25/21 1234 Oral     SpO2 12/25/21 1234 100 %     Weight 12/25/21 1222 191 lb 12.8 oz (87 kg)     Height 12/25/21 1222 5' 2"  (1.575 m)     Head Circumference --      Peak Flow --      Pain Score 12/25/21 1222 8     Pain Loc --      Pain Edu? --      Excl. in [place]? --     Most recent vital signs: Vitals:   12/25/21 1234  BP: (!) 99/53  Pulse: 79  Resp: 16  Temp: 98.6 F (37 C)  SpO2: 100%     General: Awake,  no distress.  CV:  Good peripheral perfusion.  Resp:  Normal effort.  Abd:  No distention.  Neuro:             Awake, Alert, Oriented x 3  Other:  Edema of the right maxillary region and right periorbital region and right cheek Conjunctiva are normal, pupils equal round reactive to light, no pain with extraocular movements, normal extraocular movements Normal posterior oropharynx   ED Results / Procedures / Treatments  Labs (all labs ordered are listed, but only abnormal results are displayed) Labs Reviewed - No data to display   EKG     RADIOLOGY    PROCEDURES:  Critical Care performed: No  Procedures    MEDICATIONS ORDERED IN ED: Medications  ibuprofen (ADVIL) tablet 400 mg (400 mg Oral Given 12/25/21 1301)  diphenhydrAMINE (BENADRYL) capsule 25 mg (25 mg Oral Given 12/25/21 1301)     IMPRESSION / MDM / ASSESSMENT AND PLAN / ED COURSE  I reviewed the triage vital signs and the nursing notes.  Patient's presentation is most consistent with acute, uncomplicated illness.  Differential diagnosis includes, but is not limited to, localized reaction from hymenoptera envenomation, likely anaphylaxis, periorbital cellulitis  Patient is a 41 year old female presenting with pain and swelling associated with a bee sting that occurred to the right side of her face last night.  She has no signs or symptoms of anaphylaxis including no difficulty breathing nausea vomiting urticaria.  She sustained a bee sting right over the right temple last night woke up this morning with worsening swelling and pain.  On exam she does have edema that is restricted to the right side of her face involving the eyelids mildly.  No restriction of extraocular movements and pupils are equal as is the conjunctiva.  She has no edema in the posterior oropharynx.  This is consistent with a local reaction to hymenoptera envenomation.  We will treat supportively with cool compresses  antihistamine and NSAID.  Patient is appropriate for discharge.      FINAL CLINICAL IMPRESSION(S) / ED DIAGNOSES   Final diagnoses:  Bee sting, accidental or unintentional, initial encounter     Rx / DC Orders   ED Discharge Orders     None        Note:  This document was prepared using Dragon voice recognition software and may include unintentional dictation errors.   Rada Hay, MD 12/25/21 [phone]

## 2021-12-25 NOTE — ED Triage Notes (Signed)
Pt reports was stung by a bee on the right side of her face last pm. Pt states took sertraline and ibuprofen but area is still swollen and painful.

## 2022-03-17 DIAGNOSIS — Z111 Encounter for screening for respiratory tuberculosis: Secondary | ICD-10-CM | POA: Diagnosis not present

## 2022-04-15 ENCOUNTER — Other Ambulatory Visit: Payer: Self-pay | Admitting: Internal Medicine

## 2022-04-15 DIAGNOSIS — Z1231 Encounter for screening mammogram for malignant neoplasm of breast: Secondary | ICD-10-CM

## 2022-04-26 ENCOUNTER — Emergency Department: Payer: Medicaid Other

## 2022-04-26 ENCOUNTER — Emergency Department
Admission: EM | Admit: 2022-04-26 | Discharge: 2022-04-27 | Disposition: A | Discharge status: Home / Self Care | Payer: Medicaid Other | Attending: Emergency Medicine | Admitting: Emergency Medicine

## 2022-04-26 ENCOUNTER — Other Ambulatory Visit: Payer: Self-pay

## 2022-04-26 DIAGNOSIS — R0789 Other chest pain: Secondary | ICD-10-CM | POA: Diagnosis not present

## 2022-04-26 DIAGNOSIS — D509 Iron deficiency anemia, unspecified: Secondary | ICD-10-CM | POA: Diagnosis not present

## 2022-04-26 DIAGNOSIS — R0600 Dyspnea, unspecified: Secondary | ICD-10-CM | POA: Diagnosis not present

## 2022-04-26 DIAGNOSIS — E876 Hypokalemia: Secondary | ICD-10-CM | POA: Insufficient documentation

## 2022-04-26 DIAGNOSIS — R42 Dizziness and giddiness: Secondary | ICD-10-CM | POA: Diagnosis not present

## 2022-04-26 DIAGNOSIS — D649 Anemia, unspecified: Secondary | ICD-10-CM | POA: Insufficient documentation

## 2022-04-26 DIAGNOSIS — R079 Chest pain, unspecified: Secondary | ICD-10-CM | POA: Diagnosis not present

## 2022-04-26 LAB — COMPREHENSIVE METABOLIC PANEL
ALT: 12 U/L (ref 0–44)
AST: 20 U/L (ref 15–41)
Albumin: 3.6 g/dL (ref 3.5–5.0)
Alkaline Phosphatase: 62 U/L (ref 38–126)
Anion gap: 8 (ref 5–15)
BUN: 7 mg/dL (ref 6–20)
CO2: 25 mmol/L (ref 22–32)
Calcium: 8.8 mg/dL — ABNORMAL LOW (ref 8.9–10.3)
Chloride: 105 mmol/L (ref 98–111)
Creatinine, Ser: 0.85 mg/dL (ref 0.44–1.00)
GFR, Estimated: >60 mL/min (ref >60)
Glucose, Bld: 90 mg/dL (ref 70–99)
Potassium: 3.1 mmol/L — ABNORMAL LOW (ref 3.5–5.1)
Sodium: 138 mmol/L (ref 135–145)
Total Bilirubin: 0.4 mg/dL (ref 0.3–1.2)
Total Protein: 7.1 g/dL (ref 6.5–8.1)

## 2022-04-26 LAB — CBC WITH DIFFERENTIAL/PLATELET
Abs Immature Granulocytes: 0.02 10*3/uL (ref 0.00–0.07)
Basophils Absolute: 0.1 10*3/uL (ref 0.0–0.1)
Basophils Relative: 1 %
Eosinophils Absolute: 0.2 10*3/uL (ref 0.0–0.5)
Eosinophils Relative: 3 %
HCT: 28.5 % — ABNORMAL LOW (ref 36.0–46.0)
Hemoglobin: 7.6 g/dL — ABNORMAL LOW (ref 12.0–15.0)
Immature Granulocytes: 0 %
Lymphocytes Relative: 32 %
Lymphs Abs: 1.7 10*3/uL (ref 0.7–4.0)
MCH: 17.4 pg — ABNORMAL LOW (ref 26.0–34.0)
MCHC: 26.7 g/dL — ABNORMAL LOW (ref 30.0–36.0)
MCV: 65.2 fL — ABNORMAL LOW (ref 80.0–100.0)
Monocytes Absolute: 0.4 10*3/uL (ref 0.1–1.0)
Monocytes Relative: 8 %
Neutro Abs: 3 10*3/uL (ref 1.7–7.7)
Neutrophils Relative %: 56 %
Platelets: 609 10*3/uL — ABNORMAL HIGH (ref 150–400)
RBC: 4.37 MIL/uL (ref 3.87–5.11)
RDW: 21.4 % — ABNORMAL HIGH (ref 11.5–15.5)
Smear Review: NORMAL
WBC: 5.4 10*3/uL (ref 4.0–10.5)
nRBC: 0 % (ref 0.0–0.2)

## 2022-04-26 LAB — TROPONIN I (HIGH SENSITIVITY): Troponin I (High Sensitivity): <2 ng/L (ref <18)

## 2022-04-26 NOTE — ED Provider Triage Note (Signed)
Emergency Medicine Provider Triage Evaluation Note  Annette Snyder , a 42 y.o. female  was evaluated in triage.  Pt complains of weakness, light headed, dizzy.  History of anemia secondary to ulcerative colitis.   Physical Exam  BP (!) 146/75 (BP Location: Left Arm)   Pulse 93   Temp 99.5 F (37.5 C) (Oral)   Resp 18   Ht 5\' 2"  (1.575 m)   Wt 79.4 kg   SpO2 100%   BMI 32.01 kg/m  Gen:   Awake, no distress   Resp:  Normal effort  MSK:   Moves extremities without difficulty  Other:    Medical Decision Making  Medically screening exam initiated at 8:48 PM.  Appropriate orders placed.  JELENA MALICOAT was informed that the remainder of the evaluation will be completed by another provider, this initial triage assessment does not replace that evaluation, and the importance of remaining in the ED until their evaluation is complete.    Victorino Dike, FNP 04/26/22 2300

## 2022-04-26 NOTE — ED Triage Notes (Signed)
Pt presents to ER with c/o dizziness, light headed, stiff neck and feeling very cold that has been going on for a couple of weeks, but has been getting worse today.  Pt states chest pain stays in one place and is non-radiating.  Pt endorses hx of anemia with blood transfusion most recently in 2018.  Pt is A&O x4 at this time in no acute distress.

## 2022-04-27 LAB — ABO/RH: ABO/RH(D): A POS

## 2022-04-27 MED ORDER — MORPHINE SULFATE (PF) 4 MG/ML IV SOLN
6.0000 mg | Freq: Once | INTRAVENOUS | Status: AC
  Administered 2022-04-27: 6 mg via INTRAVENOUS
  Filled 2022-04-27: qty 2

## 2022-04-27 MED ORDER — LIDOCAINE 5 % EX PTCH
1.0000 | MEDICATED_PATCH | CUTANEOUS | Status: DC
  Administered 2022-04-27: 1 via TRANSDERMAL
  Filled 2022-04-27: qty 1

## 2022-04-27 MED ORDER — POTASSIUM CHLORIDE CRYS ER 20 MEQ PO TBCR
40.0000 meq | EXTENDED_RELEASE_TABLET | Freq: Once | ORAL | Status: AC
  Administered 2022-04-27: 40 meq via ORAL
  Filled 2022-04-27: qty 2

## 2022-04-27 MED ORDER — SODIUM CHLORIDE 0.9 % IV SOLN: 10.0000 mL/h | Freq: Once | INTRAVENOUS | Status: DC

## 2022-04-27 MED ORDER — IBUPROFEN 600 MG PO TABS
600.0000 mg | ORAL_TABLET | Freq: Once | ORAL | Status: AC
  Administered 2022-04-27: 600 mg via ORAL
  Filled 2022-04-27: qty 1

## 2022-04-27 MED ORDER — ACETAMINOPHEN 500 MG PO TABS
1000.0000 mg | ORAL_TABLET | Freq: Once | ORAL | Status: AC
  Administered 2022-04-27: 1000 mg via ORAL
  Filled 2022-04-27: qty 2

## 2022-04-27 NOTE — Discharge Instructions (Addendum)
Please reach out to PCP to discuss referral to hematology through the [place].  Return to the ED with any worsening symptoms

## 2022-04-27 NOTE — ED Provider Notes (Signed)
[facility] Provider Note    Event Date/Time   First MD Initiated Contact with Patient 04/27/22 0006     (approximate)   History   Dizziness and Chest Pain   HPI  Annette Snyder is a 42 y.o. female who presents to the ED for evaluation of Dizziness and Chest Pain   I review some [facility] from 12/27 with an microcytic anemia and hemoglobin of 8.7 documented then.  Patient presents to the ED for evaluation of greater than 1 month of progressively worsening symptoms.  She reports left shoulder, neck and headaches, sensation of dizziness and be without syncope, chest discomfort and feeling cold all over.  She reports compliance with her iron supplementation over the past couple weeks.  Denies any bleeding symptoms such as hematuria, hematochezia, melena, nosebleeds, etc.  She reports having multiple PRBC transfusions in the past, for "hemoglobins of 7 and 8." During my initial evaluation of this patient, we have long discussion regarding blood transfusions and typical threshold for transfusions.  We discussed her hemoglobin not being below the typical threshold on lab values, but her increasing symptoms could certainly be attributed to her worsening IDA.  After discussing risks and benefits, she would like to go ahead for a PRBC transfusion due to her worsening symptoms.    Physical Exam   Triage Vital Signs: ED Triage Vitals  Enc Vitals Group     BP 04/26/22 2042 (!) 146/75     Pulse Rate 04/26/22 2042 93     Resp 04/26/22 2042 18     Temp 04/26/22 2042 99.5 F (37.5 C)     Temp Source 04/26/22 2042 Oral     SpO2 04/26/22 2042 100 %     Weight 04/26/22 2045 175 lb (79.4 kg)     Height 04/26/22 2045 5\' 2"  (1.575 m)     Head Circumference --      Peak Flow --      Pain Score 04/26/22 2045 5     Pain Loc --      Pain Edu? --      Excl. in GC? --     Most recent vital signs: Vitals:   04/27/22 0100 04/27/22 0215  BP: 133/78 128/78   Pulse: 69 76  Resp: 11 19  Temp:  98.9 F (37.2 C)  SpO2: 99% 100%    General: Awake, no distress.  Tearful CV:  Good peripheral perfusion.  Resp:  Normal effort.  Abd:  No distention.  Soft and benign MSK:  No deformity noted.  Neuro:  No focal deficits appreciated. Cranial nerves II through XII intact 5/5 strength and sensation in all 4 extremities Other:     ED Results / Procedures / Treatments   Labs (all labs ordered are listed, but only abnormal results are displayed) Labs Reviewed  CBC WITH DIFFERENTIAL/PLATELET - Abnormal; Notable for the following components:      Result Value   Hemoglobin 7.6 (*)    HCT 28.5 (*)    MCV 65.2 (*)    MCH 17.4 (*)    MCHC 26.7 (*)    RDW 21.4 (*)    Platelets 609 (*)    All other components within normal limits  COMPREHENSIVE METABOLIC PANEL - Abnormal; Notable for the following components:   Potassium 3.1 (*)    Calcium 8.8 (*)    All other components within normal limits  POC URINE PREG, ED  TYPE AND SCREEN  PREPARE RBC (CROSSMATCH)  ABO/RH  TROPONIN I (HIGH SENSITIVITY)  TROPONIN I (HIGH SENSITIVITY)    EKG Sinus rhythm with a rate of 97 bpm.  Normal axis and intervals.  No clear signs of acute ischemia.  RADIOLOGY CXR interpreted by me without evidence of acute cardiopulmonary pathology.  Official radiology report(s): DG Chest 1 View  Result Date: 04/26/2022 CLINICAL DATA:  Chest pain, dyspnea EXAM: CHEST  1 VIEW COMPARISON:  02/14/2020 FINDINGS: The heart size and mediastinal contours are within normal limits. Both lungs are clear. The visualized skeletal structures are unremarkable. IMPRESSION: No active disease. Electronically Signed   By: Fidela Salisbury M.D.   On: 04/26/2022 21:16    PROCEDURES and INTERVENTIONS:  .1-3 Lead EKG Interpretation  Performed by: Vladimir Crofts, MD Authorized by: Vladimir Crofts, MD     Interpretation: normal     ECG rate:  78   ECG rate assessment: normal     Rhythm: sinus rhythm      Ectopy: none     Conduction: normal   .Critical Care  Performed by: Vladimir Crofts, MD Authorized by: Vladimir Crofts, MD   Critical care provider statement:    Critical care time (minutes):  30   Critical care time was exclusive of:  Separately billable procedures and treating other patients   Critical care was necessary to treat or prevent imminent or life-threatening deterioration of the following conditions:  Circulatory failure   Critical care was time spent personally by me on the following activities:  Development of treatment plan with patient or surrogate, discussions with consultants, evaluation of patient's response to treatment, examination of patient, ordering and review of laboratory studies, ordering and review of radiographic studies, ordering and performing treatments and interventions, pulse oximetry, re-evaluation of patient's condition and review of old charts   Medications  0.9 %  sodium chloride infusion (0 mL/hr Intravenous Hold 04/27/22 0242)  lidocaine (LIDODERM) 5 % 1 patch (has no administration in time range)  morphine (PF) 4 MG/ML injection 6 mg (has no administration in time range)  acetaminophen (TYLENOL) tablet 1,000 mg (1,000 mg Oral Given 04/27/22 0143)  ibuprofen (ADVIL) tablet 600 mg (600 mg Oral Given 04/27/22 0143)  potassium chloride SA (KLOR-CON M) CR tablet 40 mEq (40 mEq Oral Given 04/27/22 0143)     IMPRESSION / MDM / ASSESSMENT AND PLAN / ED COURSE  I reviewed the triage vital signs and the nursing notes.  Differential diagnosis includes, but is not limited to, ACS, PTX, PNA, muscle strain/spasm, PE, dissection  {Patient presents with symptoms of an acute illness or injury that is potentially life-threatening.  42 year old female presents to the ED with chronically progressive symptoms, possibly due to symptomatic iron deficiency anemia.  Stable without signs of shock.  Blood work with a microcytic anemia with a hemoglobin of 7.6 prior to IV fluid  administration.  Metabolic panel with mild hypokalemia.  Negative troponins.  Doubt ACS.  EKG and CXR are reassuring.  As documented above, long discussion regarding plan of care and patient is requesting a blood transfusion.  She is a PCP through the [place] and we discussed the importance of a hematologist as well.  We will plan to provide 1 unit of PRBCs and discharged after a time of observation to ensure no complications      FINAL CLINICAL IMPRESSION(S) / ED DIAGNOSES   Final diagnoses:  Symptomatic anemia  Dizziness  Other chest pain     Rx / DC Orders   ED Discharge Orders  None        Note:  This document was prepared using Dragon voice recognition software and may include unintentional dictation errors.   Vladimir Crofts, MD 04/27/22 [phone]

## 2022-04-28 LAB — BPAM RBC
Blood Product Expiration Date: 202402122359
ISSUE DATE / TIME: 202401100447
Unit Type and Rh: 6200

## 2022-04-28 LAB — TYPE AND SCREEN
ABO/RH(D): A POS
Antibody Screen: NEGATIVE
Unit division: 0

## 2022-04-29 LAB — PREPARE RBC (CROSSMATCH)

## 2022-04-30 DIAGNOSIS — Z5321 Procedure and treatment not carried out due to patient leaving prior to being seen by health care provider: Secondary | ICD-10-CM | POA: Diagnosis not present

## 2022-05-01 DIAGNOSIS — R42 Dizziness and giddiness: Secondary | ICD-10-CM | POA: Diagnosis not present

## 2022-05-01 DIAGNOSIS — R531 Weakness: Secondary | ICD-10-CM | POA: Diagnosis not present

## 2022-05-02 IMAGING — CR DG CHEST 2V
2 series · 2 of 2 positions shown · non-contrast
Comparison: Chest radiograph dated 03/24/2016.

CLINICAL DATA: 39-year-old female with cough.

EXAM:
CHEST - 2 VIEW

[w chest pa]
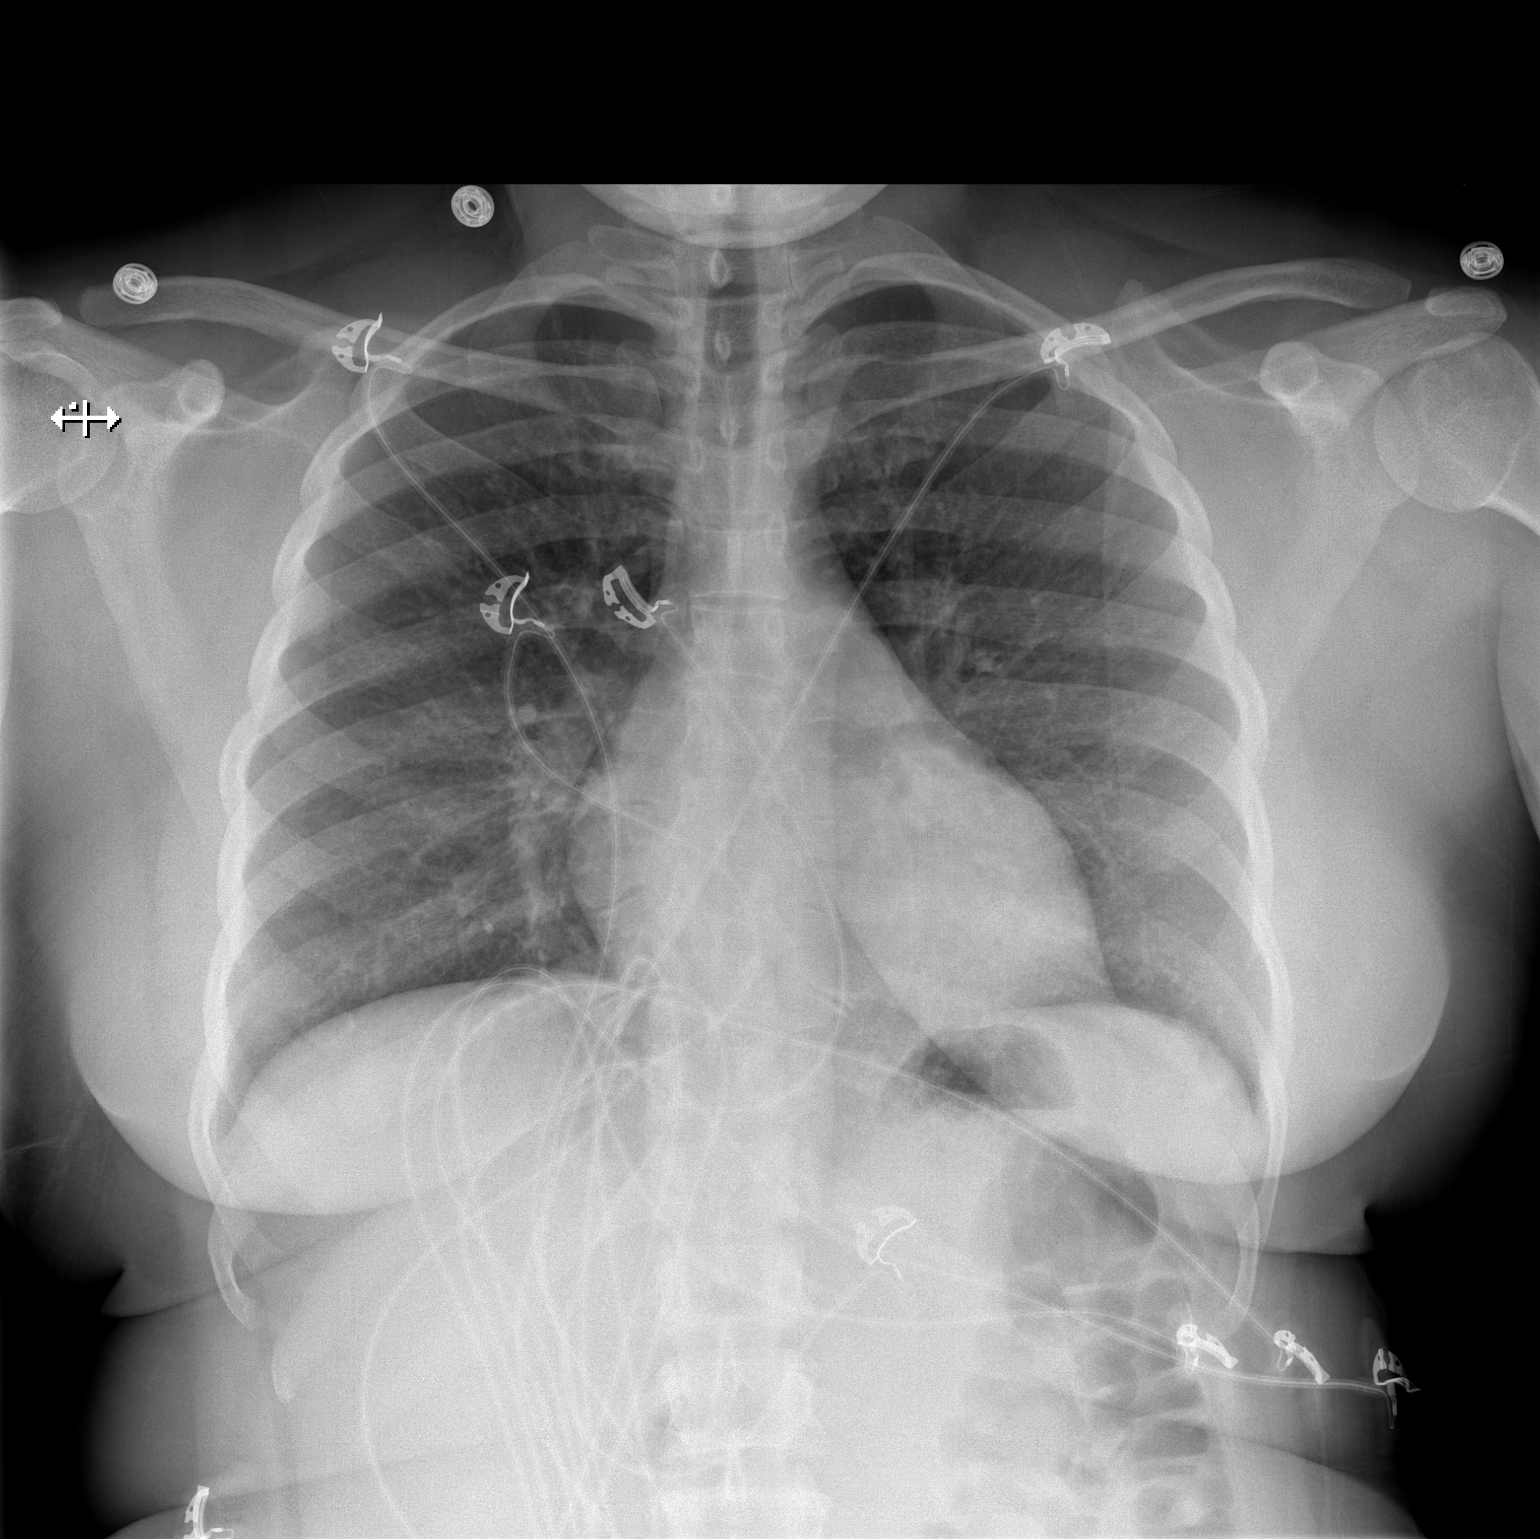

[w chest lat]
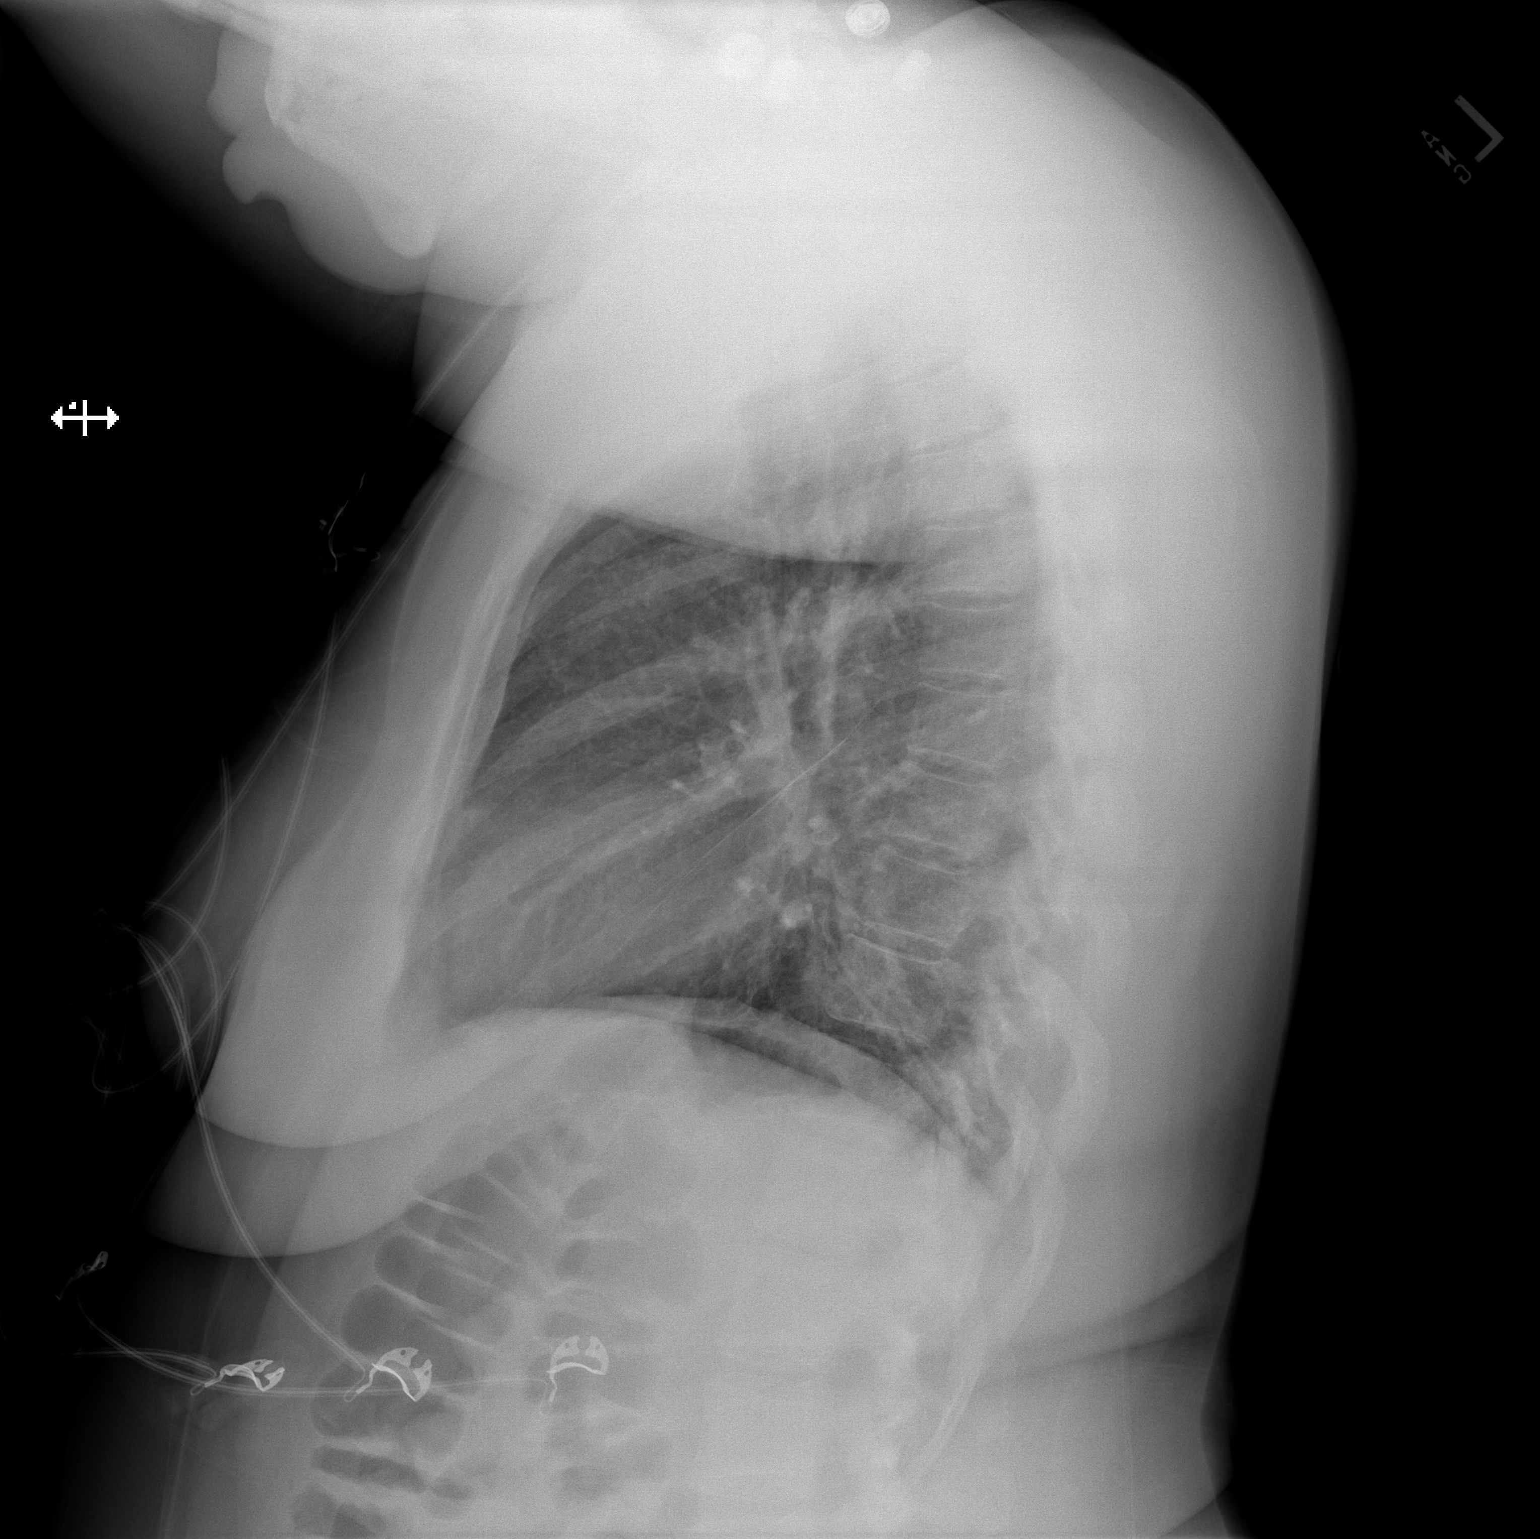

[2 of 2 positions shown; findings below may reference images not displayed]

FINDINGS: The heart size and mediastinal contours are within normal limits.
Both lungs are clear. The visualized skeletal structures are
unremarkable.
IMPRESSION: No active cardiopulmonary disease.

## 2022-05-25 DIAGNOSIS — F32A Depression, unspecified: Secondary | ICD-10-CM | POA: Diagnosis not present

## 2022-05-25 DIAGNOSIS — M5441 Lumbago with sciatica, right side: Secondary | ICD-10-CM | POA: Diagnosis not present

## 2022-05-25 DIAGNOSIS — F988 Other specified behavioral and emotional disorders with onset usually occurring in childhood and adolescence: Secondary | ICD-10-CM | POA: Diagnosis not present

## 2022-05-25 DIAGNOSIS — N854 Malposition of uterus: Secondary | ICD-10-CM | POA: Diagnosis not present

## 2022-05-25 DIAGNOSIS — Z7982 Long term (current) use of aspirin: Secondary | ICD-10-CM | POA: Diagnosis not present

## 2022-05-25 DIAGNOSIS — R109 Unspecified abdominal pain: Secondary | ICD-10-CM | POA: Diagnosis not present

## 2022-05-25 DIAGNOSIS — M5416 Radiculopathy, lumbar region: Secondary | ICD-10-CM | POA: Diagnosis not present

## 2022-05-25 DIAGNOSIS — R140 Abdominal distension (gaseous): Secondary | ICD-10-CM | POA: Diagnosis not present

## 2022-05-25 DIAGNOSIS — F4310 Post-traumatic stress disorder, unspecified: Secondary | ICD-10-CM | POA: Diagnosis not present

## 2022-05-25 DIAGNOSIS — R1031 Right lower quadrant pain: Secondary | ICD-10-CM | POA: Diagnosis not present

## 2022-05-26 DIAGNOSIS — R140 Abdominal distension (gaseous): Secondary | ICD-10-CM | POA: Diagnosis not present

## 2022-05-26 DIAGNOSIS — N854 Malposition of uterus: Secondary | ICD-10-CM | POA: Diagnosis not present

## 2022-06-03 ENCOUNTER — Other Ambulatory Visit: Payer: Self-pay | Admitting: Internal Medicine

## 2022-06-03 DIAGNOSIS — N643 Galactorrhea not associated with childbirth: Secondary | ICD-10-CM

## 2022-06-14 ENCOUNTER — Ambulatory Visit
Admission: RE | Admit: 2022-06-14 | Discharge: 2022-06-14 | Disposition: A | Discharge status: Home / Self Care | Payer: Medicaid Other | Source: Ambulatory Visit | Attending: Internal Medicine | Admitting: Internal Medicine

## 2022-06-14 ENCOUNTER — Encounter: Payer: Self-pay | Admitting: Radiology

## 2022-06-14 DIAGNOSIS — N643 Galactorrhea not associated with childbirth: Secondary | ICD-10-CM

## 2022-12-16 DIAGNOSIS — M25551 Pain in right hip: Secondary | ICD-10-CM | POA: Diagnosis not present

## 2022-12-16 DIAGNOSIS — M1611 Unilateral primary osteoarthritis, right hip: Secondary | ICD-10-CM | POA: Diagnosis not present

## 2023-01-11 ENCOUNTER — Inpatient Hospital Stay
Admission: RE | Admit: 2023-01-11 | Discharge: 2023-01-11 | Disposition: A | Discharge status: Home / Self Care | Payer: Self-pay | Source: Ambulatory Visit | Attending: Neurosurgery | Admitting: Neurosurgery

## 2023-01-11 ENCOUNTER — Other Ambulatory Visit: Payer: Self-pay

## 2023-01-11 DIAGNOSIS — Z049 Encounter for examination and observation for unspecified reason: Secondary | ICD-10-CM

## 2023-01-12 NOTE — Progress Notes (Signed)
Referring Physician:  Clinic, [facility] [address] [facility] [place],  [place] [address]  Primary Physician:  [facility]  History of Present Illness: 01/17/2023 [name] is here today with a chief complaint of low back and right buttock, groin and leg pain. She has some numbness and tingling in the leg.  Her pain is still around her right groin, the outside of her right hip, and her lower right portion of her back.  The pain is described as a combination of numbness, tingling, and a sensation of cold water dripping down the leg. The onset was insidious, with no identifiable precipitating event or trauma. The discomfort is localized to the right buttock and radiates around the groin, down the back of the leg, and to the foot and toes. The pain is exacerbated by sitting and is constant in nature.  The patient has been undergoing physical therapy at the [place], initially for pelvic floor weakness and urinary incontinence. The patient reports some improvement in urinary control but still experiences occasional accidents.  The patient has also been experiencing pain in the hip area, which has not been imaged. The patient recently quit smoking after learning it could exacerbate her pain.  Her pain is made worse by sitting on her right side, walking, or driving.  Laying on her left side improves it.  She is scheduled for an injection in the next couple of weeks.  She is seeing the pain clinic through the [place].   Bowel/Bladder Dysfunction: none  Conservative measures:  Physical therapy:Has participated in PT with [facility] 6 sessions, discharged.  Multimodal medical therapy including regular antiinflammatories:   Injections:  has received epidural steroid injections scheduled for SI joint on 02/07/23  Past Surgery: no  Annette Snyder has no symptoms of cervical myelopathy.  The symptoms are causing a significant impact on the patient's life.    I have utilized the care everywhere function in epic to review the outside records available from external health systems.  Review of Systems:  A 10 point review of systems is negative, except for the pertinent positives and negatives detailed in the HPI.  Past Medical History: Past Medical History:  Diagnosis Date   Anxiety    Bipolar disorder (HCC)    Chronic back pain    Colitis    PTSD (post-traumatic stress disorder)     Past Surgical History: No past surgical history on file.  Allergies: Allergies as of 01/17/2023 - Review Complete 04/26/2022  Allergen Reaction Noted   Mesalamine Hives 02/02/2017   Vedolizumab  06/11/2020   Flagyl [metronidazole] Rash 02/09/2017   Quetiapine fumarate Other (See Comments) and Palpitations 03/17/2016   Seroquel [quetiapine] Rash 02/09/2017    Medications:  Current Outpatient Medications:    escitalopram (LEXAPRO) 10 MG tablet, Take 10 mg by mouth daily., Disp: , Rfl:    lisdexamfetamine (VYVANSE) 20 MG capsule, Take 20 mg by mouth daily., Disp: , Rfl:    meloxicam (MOBIC) 15 MG tablet, Take 1 tablet (15 mg total) by mouth daily., Disp: 30 tablet, Rfl: 2   nicotine (NICODERM CQ - DOSED IN MG/24 HOURS) 21 mg/24hr patch, Place 1 patch (21 mg total) onto the skin daily., Disp: 28 patch, Rfl: 0  Social History: Social History   Tobacco Use   Smoking status: Former   Smokeless tobacco: Never  Vaping Use   Vaping status: Never Used  Substance Use Topics   Alcohol use: No   Drug use: No  Family Medical History: Family History  Problem Relation Age of Onset   Diabetes Mother    Glaucoma Mother    Hypertension Mother    Blindness Mother    Hypertension Father    Stroke Father    Depression Maternal Grandmother    Depression Maternal Grandfather    Bipolar disorder Brother    Crohn's disease Brother    Bipolar disorder Brother    Breast cancer Neg Hx     Physical Examination: There were no vitals filed for this  visit.  General: Patient is in no apparent distress. Attention to examination is appropriate.  Neck:   Supple.  Full range of motion.  Respiratory: Patient is breathing without any difficulty.   NEUROLOGICAL:     Awake, alert, oriented to person, place, and time.  Speech is clear and fluent.   Cranial Nerves: Pupils equal round and reactive to light.  Facial tone is symmetric.  Facial sensation is symmetric. Shoulder shrug is symmetric. Tongue protrusion is midline.  There is no pronator drift.  Strength: Side Biceps Triceps Deltoid Interossei Grip Wrist Ext. Wrist Flex.  R 5 5 5 5 5 5 5   L 5 5 5 5 5 5 5    Side Iliopsoas Quads Hamstring PF DF EHL  R 5 5 5 5 5 5   L 5 5 5 5 5 5    Reflexes are 1+ and symmetric at the biceps, triceps, brachioradialis, patella and achilles.   Hoffman's is absent.   Bilateral upper and lower extremity sensation is intact to light touch.    No evidence of dysmetria noted.  Gait is antalgic.    She has positive FABER test on the right.  She has pain to palpation around her right SI joint and her right greater trochanter.   Medical Decision Making  Imaging: MRI L spine 12/16/2022 There is abnormal diffuse T1 bone marrow signal which appears isointense to hypointense relative to adjacent disc.  This finding is nonspecific but can be seen in individuals with chronic anemia, chronic hypoxemia such as smoking, or other marrow infiltrative myeloproliferative disorder.  In the referral, all the disc levels are fully reviewed.  There is no significant neuroforaminal or central narrowing at any level.  There is mild to moderate bilateral facet degenerative changes at L5-S1.  I have personally reviewed the images and agree with the above interpretation.  Assessment and Plan: [name] is a pleasant 42 y.o. female with nonspecific findings on her MRI scan.  I do not think this is clinically pertinent.   Lower Back and Right Leg Pain This seems most  consistent with sacroiliitis with possible right hip pain suggestive of osteoarthritis in the hip joint as well as possible bursitis on the right side.  I agree with her SI joint injection that is scheduled.  If she has greater than 50% relief with her SI joint injection, I would recommend a second injection.  If that also confirms improvement in her pain, she may be a candidate for right SI joint fusion.  I encouraged her to reach back out to the clinic so that we can help her with guidance on a referral if she is a candidate for an SI joint fusion after 2 injections.  I would recommend consideration of hip injections.  She has had an x-ray which did not show an obvious cause of her pain.  She may ultimately need an MRI scan of the right hip, but I will defer to her pain management and  primary care providers within the [place] system for this.  I spent a total of 30 minutes in this patient's care today. This time was spent reviewing pertinent records including imaging studies, obtaining and confirming history, performing a directed evaluation, formulating and discussing my recommendations, and documenting the visit within the medical record.    Thank you for involving me in the care of this patient.      Sanjeev Main K. Myer Haff MD, [facility]

## 2023-01-17 ENCOUNTER — Ambulatory Visit (INDEPENDENT_AMBULATORY_CARE_PROVIDER_SITE_OTHER): Payer: No Typology Code available for payment source | Admitting: Neurosurgery

## 2023-01-17 ENCOUNTER — Encounter: Payer: Self-pay | Admitting: Neurosurgery

## 2023-01-17 DIAGNOSIS — G8929 Other chronic pain: Secondary | ICD-10-CM

## 2023-01-17 DIAGNOSIS — M25551 Pain in right hip: Secondary | ICD-10-CM

## 2023-01-17 DIAGNOSIS — M545 Low back pain, unspecified: Secondary | ICD-10-CM | POA: Diagnosis not present

## 2023-01-17 DIAGNOSIS — M533 Sacrococcygeal disorders, not elsewhere classified: Secondary | ICD-10-CM

## 2023-05-02 DIAGNOSIS — H6692 Otitis media, unspecified, left ear: Secondary | ICD-10-CM | POA: Diagnosis not present

## 2023-05-12 LAB — HM PAP SMEAR: HM Pap smear: NEGATIVE

## 2023-05-16 ENCOUNTER — Other Ambulatory Visit: Payer: Self-pay | Admitting: Internal Medicine

## 2023-05-16 DIAGNOSIS — Z1231 Encounter for screening mammogram for malignant neoplasm of breast: Secondary | ICD-10-CM

## 2023-05-26 DIAGNOSIS — N643 Galactorrhea not associated with childbirth: Secondary | ICD-10-CM | POA: Diagnosis not present

## 2023-06-06 DIAGNOSIS — R1031 Right lower quadrant pain: Secondary | ICD-10-CM | POA: Diagnosis not present

## 2023-06-06 DIAGNOSIS — R102 Pelvic and perineal pain: Secondary | ICD-10-CM | POA: Diagnosis not present

## 2023-06-06 DIAGNOSIS — R0602 Shortness of breath: Secondary | ICD-10-CM | POA: Diagnosis not present

## 2023-06-06 DIAGNOSIS — B6989 Cysticercosis of other sites: Secondary | ICD-10-CM | POA: Diagnosis not present

## 2023-06-06 DIAGNOSIS — N8003 Adenomyosis of the uterus: Secondary | ICD-10-CM | POA: Diagnosis not present

## 2023-06-06 DIAGNOSIS — N760 Acute vaginitis: Secondary | ICD-10-CM | POA: Diagnosis not present

## 2023-06-06 DIAGNOSIS — R112 Nausea with vomiting, unspecified: Secondary | ICD-10-CM | POA: Diagnosis not present

## 2023-06-06 DIAGNOSIS — D259 Leiomyoma of uterus, unspecified: Secondary | ICD-10-CM | POA: Diagnosis not present

## 2023-06-07 DIAGNOSIS — R102 Pelvic and perineal pain: Secondary | ICD-10-CM | POA: Diagnosis not present

## 2023-06-20 ENCOUNTER — Encounter: Payer: Self-pay | Admitting: Radiology

## 2023-06-20 ENCOUNTER — Ambulatory Visit
Admission: RE | Admit: 2023-06-20 | Discharge: 2023-06-20 | Disposition: A | Discharge status: Home / Self Care | Payer: No Typology Code available for payment source | Source: Ambulatory Visit | Attending: Internal Medicine | Admitting: Internal Medicine

## 2023-06-20 DIAGNOSIS — Z1231 Encounter for screening mammogram for malignant neoplasm of breast: Secondary | ICD-10-CM | POA: Diagnosis present

## 2023-06-23 DIAGNOSIS — R102 Pelvic and perineal pain: Secondary | ICD-10-CM | POA: Diagnosis not present

## 2023-07-05 DIAGNOSIS — R531 Weakness: Secondary | ICD-10-CM | POA: Diagnosis not present

## 2023-07-05 DIAGNOSIS — Z5321 Procedure and treatment not carried out due to patient leaving prior to being seen by health care provider: Secondary | ICD-10-CM | POA: Diagnosis not present

## 2023-07-05 DIAGNOSIS — R1030 Lower abdominal pain, unspecified: Secondary | ICD-10-CM | POA: Diagnosis not present

## 2023-07-06 DIAGNOSIS — R000 Tachycardia, unspecified: Secondary | ICD-10-CM | POA: Diagnosis not present

## 2023-08-05 ENCOUNTER — Other Ambulatory Visit: Payer: Self-pay

## 2023-08-05 ENCOUNTER — Emergency Department
Admission: EM | Admit: 2023-08-05 | Discharge: 2023-08-06 | Disposition: A | Discharge status: Home / Self Care | Attending: Emergency Medicine | Admitting: Emergency Medicine

## 2023-08-05 ENCOUNTER — Emergency Department

## 2023-08-05 DIAGNOSIS — R195 Other fecal abnormalities: Secondary | ICD-10-CM | POA: Diagnosis not present

## 2023-08-05 DIAGNOSIS — K921 Melena: Secondary | ICD-10-CM

## 2023-08-05 DIAGNOSIS — R1032 Left lower quadrant pain: Secondary | ICD-10-CM | POA: Diagnosis not present

## 2023-08-05 DIAGNOSIS — R112 Nausea with vomiting, unspecified: Secondary | ICD-10-CM | POA: Diagnosis not present

## 2023-08-05 DIAGNOSIS — R1084 Generalized abdominal pain: Secondary | ICD-10-CM | POA: Diagnosis not present

## 2023-08-05 LAB — CBC
HCT: 36.7 % (ref 36.0–46.0)
Hemoglobin: 12 g/dL (ref 12.0–15.0)
MCH: 27.5 pg (ref 26.0–34.0)
MCHC: 32.7 g/dL (ref 30.0–36.0)
MCV: 84 fL (ref 80.0–100.0)
Platelets: 343 10*3/uL (ref 150–400)
RBC: 4.37 MIL/uL (ref 3.87–5.11)
RDW: 23.1 % — ABNORMAL HIGH (ref 11.5–15.5)
WBC: 8.7 10*3/uL (ref 4.0–10.5)
nRBC: 0 % (ref 0.0–0.2)

## 2023-08-05 LAB — COMPREHENSIVE METABOLIC PANEL WITH GFR
ALT: 15 U/L (ref 0–44)
AST: 28 U/L (ref 15–41)
Albumin: 3.6 g/dL (ref 3.5–5.0)
Alkaline Phosphatase: 59 U/L (ref 38–126)
Anion gap: 13 (ref 5–15)
BUN: <5 mg/dL — ABNORMAL LOW (ref 6–20)
CO2: 19 mmol/L — ABNORMAL LOW (ref 22–32)
Calcium: 8.9 mg/dL (ref 8.9–10.3)
Chloride: 105 mmol/L (ref 98–111)
Creatinine, Ser: 0.83 mg/dL (ref 0.44–1.00)
GFR, Estimated: >60 mL/min (ref >60)
Glucose, Bld: 103 mg/dL — ABNORMAL HIGH (ref 70–99)
Potassium: 4.2 mmol/L (ref 3.5–5.1)
Sodium: 137 mmol/L (ref 135–145)
Total Bilirubin: 0.6 mg/dL (ref 0.0–1.2)
Total Protein: 7.2 g/dL (ref 6.5–8.1)

## 2023-08-05 LAB — LIPASE, BLOOD: Lipase: 37 U/L (ref 11–51)

## 2023-08-05 LAB — HCG, QUANTITATIVE, PREGNANCY: hCG, Beta Chain, Quant, S: <1 m[IU]/mL (ref <5)

## 2023-08-05 MED ORDER — ONDANSETRON HCL 4 MG/2ML IJ SOLN
4.0000 mg | Freq: Once | INTRAMUSCULAR | Status: AC
  Administered 2023-08-05: 4 mg via INTRAVENOUS
  Filled 2023-08-05: qty 2

## 2023-08-05 MED ORDER — IOHEXOL 300 MG/ML  SOLN
100.0000 mL | Freq: Once | INTRAMUSCULAR | Status: AC | PRN
  Administered 2023-08-05: 100 mL via INTRAVENOUS

## 2023-08-05 MED ORDER — LACTATED RINGERS IV BOLUS
1000.0000 mL | Freq: Once | INTRAVENOUS | Status: AC
  Administered 2023-08-05: 1000 mL via INTRAVENOUS

## 2023-08-05 MED ORDER — MORPHINE SULFATE (PF) 4 MG/ML IV SOLN
4.0000 mg | Freq: Once | INTRAVENOUS | Status: AC
  Administered 2023-08-05: 4 mg via INTRAVENOUS
  Filled 2023-08-05: qty 1

## 2023-08-05 NOTE — ED Provider Notes (Signed)
-----------------------------------------   11:21 PM on 08/05/2023 -----------------------------------------  Assuming care from Dr. Drenda Gentle.  In short, Annette Snyder is a 43 y.o. female with a chief complaint of abdominal pain, probably UC flare.  Refer to the original H&P for additional details.  The current plan of care is to follow up CT scan and reassess.   Clinical Course as of 08/06/23 0031  Sat Aug 05, 2023  2217 Independent review of labs, electrolytes not severely deranged, creatinine is normal, LFTs are normal, no leukocytosis, lipase is normal, hCG is not elevated. [TT]  2351 CT ABDOMEN PELVIS W CONTRAST I personally viewed and interpreted the patient's CT of the abdomen and pelvis and I see no evidence of inflammatory changes, abscess, or other acute infection.  I also read the radiologist's report, which confirmed no acute findings. [CF]  Sun Aug 06, 2023  0026 Reassessed the patient and updated her with the results.  She is feeling better, still having some discomfort.  She told me that she had eaten some bacon that she thinks might be bad and that could have set off the symptoms.  I let her know about no clear evidence of an ulcerative colitis flare.  I am giving her information about local GI options so that she can establish care in [place] if she would like to do so.  She is comfortable with the plan for discharge and outpatient follow-up.  Medications as listed below after checking the [place]  controlled substance database.  The patient's medical screening exam is reassuring with no indication of an emergent medical condition requiring hospitalization or additional evaluation at this point.  The patient is safe and appropriate for discharge and outpatient follow up. [CF]    Clinical Course User Index [CF] Lynnda Sas, MD [TT] Shane Darling, MD     Medications  lactated ringers  bolus 1,000 mL (1,000 mLs Intravenous New Bag/Given 08/05/23 2220)  ondansetron   (ZOFRAN ) injection 4 mg (4 mg Intravenous Given 08/05/23 2220)  morphine  (PF) 4 MG/ML injection 4 mg (4 mg Intravenous Given 08/05/23 2220)  iohexol  (OMNIPAQUE ) 300 MG/ML solution 100 mL (100 mLs Intravenous Contrast Given 08/05/23 2236)     ED Discharge Orders          Ordered    HYDROcodone -acetaminophen  (NORCO/VICODIN) 5-325 MG tablet  Every 6 hours PRN        08/06/23 0030    ondansetron  (ZOFRAN -ODT) 4 MG disintegrating tablet        08/06/23 0030           Final diagnoses:  Left lower quadrant abdominal pain  Nausea and vomiting, unspecified vomiting type  Blood in stool     Lynnda Sas, MD 08/06/23 0031

## 2023-08-05 NOTE — ED Provider Notes (Signed)
 [facility] Provider Note    Event Date/Time   First MD Initiated Contact with Patient 08/05/23 2133     (approximate)   History   Abdominal Pain   HPI  Annette Snyder is a 43 y.o. female with bipolar disorder, ulcerative colitis, anxiety, presenting with left lower quadrant abdominal pain as well as blood in her stool for the last 2 to 3 days.  Patient states that she had some nausea vomiting at home but none now.  No fever.  No chest pain or shortness of breath or cough, no urinary symptoms or vaginal discharge or bleeding.  States that last time she saw her GI doctor was 6 months ago.  States that she last had a bowel movement today.  Feels like the pain is gnawing.  Does not take any blood thinners at baseline.  On independent chart review, she had a CT scan that was done in February of this year that showed bilateral cystic lesions, no other acute pathology within the pelvis or abdomen.     Physical Exam   Triage Vital Signs: ED Triage Vitals  Encounter Vitals Group     BP 08/05/23 2028 121/62     Systolic BP Percentile --      Diastolic BP Percentile --      Pulse Rate 08/05/23 2028 90     Resp 08/05/23 2028 16     Temp 08/05/23 2028 98.7 F (37.1 C)     Temp Source 08/05/23 2028 Oral     SpO2 08/05/23 2017 100 %     Weight --      Height 08/05/23 2028 5\' 2"  (1.575 m)     Head Circumference --      Peak Flow --      Pain Score 08/05/23 2028 10     Pain Loc --      Pain Education --      Exclude from Growth Chart --     Most recent vital signs: Vitals:   08/05/23 2017 08/05/23 2028  BP:  121/62  Pulse:  90  Resp:  16  Temp:  98.7 F (37.1 C)  SpO2: 100% 99%     General: Awake, no distress.  CV:  Good peripheral perfusion.  Resp:  Normal effort.  Abd:  No distention.  Soft, tender in left lower quadrant, left upper quadrant, no guarding. Other:  No lower extremity edema.  Rectal exam was done with chaperone, no stool in  rectal vault, no bloody mucosa, she does have external hemorrhoids that are nonthrombosed.   ED Results / Procedures / Treatments   Labs (all labs ordered are listed, but only abnormal results are displayed) Labs Reviewed  COMPREHENSIVE METABOLIC PANEL WITH GFR - Abnormal; Notable for the following components:      Result Value   CO2 19 (*)    Glucose, Bld 103 (*)    BUN <5 (*)    All other components within normal limits  CBC - Abnormal; Notable for the following components:   RDW 23.1 (*)    All other components within normal limits  LIPASE, BLOOD  HCG, QUANTITATIVE, PREGNANCY  URINALYSIS, ROUTINE W REFLEX MICROSCOPIC      PROCEDURES:  Critical Care performed: No  Procedures   MEDICATIONS ORDERED IN ED: Medications  lactated ringers  bolus 1,000 mL (1,000 mLs Intravenous New Bag/Given 08/05/23 2220)  ondansetron  (ZOFRAN ) injection 4 mg (4 mg Intravenous Given 08/05/23 2220)  morphine  (PF) 4 MG/ML  injection 4 mg (4 mg Intravenous Given 08/05/23 2220)  iohexol  (OMNIPAQUE ) 300 MG/ML solution 100 mL (100 mLs Intravenous Contrast Given 08/05/23 2236)     IMPRESSION / MDM / ASSESSMENT AND PLAN / ED COURSE  I reviewed the triage vital signs and the nursing notes.                              Differential diagnosis includes, but is not limited to, ulcerative colitis flare, colitis, diverticulitis, GI bleed.  Get labs, CT abdomen pelvis, IV fluids, Zofran , IV morphine .  Patient's presentation is most consistent with acute presentation with potential threat to life or bodily function.  Independent review of labs imaging below.  Patient signed out pending imaging results and reassessment.  Clinical Course as of 08/05/23 2316  Sat Aug 05, 2023  2217 Independent review of labs, electrolytes not severely deranged, creatinine is normal, LFTs are normal, no leukocytosis, lipase is normal, hCG is not elevated. [TT]    Clinical Course User Index [TT] Drenda Gentle Richard Champion, MD     FINAL  CLINICAL IMPRESSION(S) / ED DIAGNOSES   Final diagnoses:  Left lower quadrant abdominal pain  Nausea and vomiting, unspecified vomiting type  Blood in stool     Rx / DC Orders   ED Discharge Orders     None        Note:  This document was prepared using Dragon voice recognition software and may include unintentional dictation errors.    Shane Darling, MD 08/05/23 [phone]

## 2023-08-05 NOTE — ED Notes (Signed)
 First nurse note: Pt to ED via [facility] c/o sudden LLQ pain. Radiates downwards.

## 2023-08-05 NOTE — ED Triage Notes (Signed)
 Pt to ed from home via [facility] for abd pain. Pt has HX of ulcerative colitis with a hernia and GERD. Pt is crying in triage but no active vomiting. Pt is caox4.

## 2023-08-05 NOTE — ED Notes (Signed)
 Pt is sleeping in subwait.

## 2023-08-05 NOTE — ED Notes (Signed)
 RN asked MD about pain control at request of pt.

## 2023-08-06 MED ORDER — HYDROCODONE-ACETAMINOPHEN 5-325 MG PO TABS: 2.0000 | ORAL_TABLET | Freq: Four times a day (QID) | ORAL | 0 refills | Status: AC | PRN

## 2023-08-06 MED ORDER — ONDANSETRON 4 MG PO TBDP: ORAL_TABLET | ORAL | 0 refills | Status: AC

## 2023-08-06 NOTE — Discharge Instructions (Addendum)
 We believe your symptoms are caused by either a viral infection or possibly a bad food exposure.  Your lab work and your CT scan are reassuring and this could be your ulcerative colitis, but it does not appear consistent with a severe flare.  Either way, we feel it is safe for you to go home and follow up with your regular doctor.  Please read the included information and stick to a bland diet for the next two days.  Drink plenty of clear fluids, and if you were provided with a prescription, please take it according to the label instructions.    If you develop any new or worsening symptoms, including persistent vomiting not controlled with medication, fever greater than 101, severe or worsening abdominal pain, or other symptoms that concern you, please return immediately to the Emergency Department.

## 2023-08-29 DIAGNOSIS — R102 Pelvic and perineal pain: Secondary | ICD-10-CM | POA: Diagnosis not present

## 2023-08-29 DIAGNOSIS — G8929 Other chronic pain: Secondary | ICD-10-CM | POA: Diagnosis not present

## 2023-10-10 DIAGNOSIS — E559 Vitamin D deficiency, unspecified: Secondary | ICD-10-CM | POA: Diagnosis not present

## 2023-10-10 DIAGNOSIS — F321 Major depressive disorder, single episode, moderate: Secondary | ICD-10-CM | POA: Diagnosis not present

## 2023-10-10 DIAGNOSIS — D649 Anemia, unspecified: Secondary | ICD-10-CM | POA: Diagnosis not present

## 2023-10-10 DIAGNOSIS — Z131 Encounter for screening for diabetes mellitus: Secondary | ICD-10-CM | POA: Diagnosis not present

## 2023-10-10 DIAGNOSIS — R635 Abnormal weight gain: Secondary | ICD-10-CM | POA: Diagnosis not present

## 2023-10-10 DIAGNOSIS — N951 Menopausal and female climacteric states: Secondary | ICD-10-CM | POA: Diagnosis not present

## 2023-10-10 DIAGNOSIS — Z6836 Body mass index (BMI) 36.0-36.9, adult: Secondary | ICD-10-CM | POA: Diagnosis not present

## 2023-10-10 DIAGNOSIS — Z13220 Encounter for screening for lipoid disorders: Secondary | ICD-10-CM | POA: Diagnosis not present

## 2023-10-10 DIAGNOSIS — G4733 Obstructive sleep apnea (adult) (pediatric): Secondary | ICD-10-CM | POA: Diagnosis not present

## 2023-10-12 DIAGNOSIS — F32A Depression, unspecified: Secondary | ICD-10-CM | POA: Diagnosis not present

## 2023-10-12 DIAGNOSIS — N8003 Adenomyosis of the uterus: Secondary | ICD-10-CM | POA: Diagnosis not present

## 2023-10-12 DIAGNOSIS — R1011 Right upper quadrant pain: Secondary | ICD-10-CM | POA: Diagnosis not present

## 2023-10-12 DIAGNOSIS — R1031 Right lower quadrant pain: Secondary | ICD-10-CM | POA: Diagnosis not present

## 2023-10-12 DIAGNOSIS — F909 Attention-deficit hyperactivity disorder, unspecified type: Secondary | ICD-10-CM | POA: Diagnosis not present

## 2023-10-12 DIAGNOSIS — E278 Other specified disorders of adrenal gland: Secondary | ICD-10-CM | POA: Diagnosis not present

## 2023-10-12 DIAGNOSIS — Z888 Allergy status to other drugs, medicaments and biological substances status: Secondary | ICD-10-CM | POA: Diagnosis not present

## 2023-10-12 DIAGNOSIS — F419 Anxiety disorder, unspecified: Secondary | ICD-10-CM | POA: Diagnosis not present

## 2023-10-12 DIAGNOSIS — K219 Gastro-esophageal reflux disease without esophagitis: Secondary | ICD-10-CM | POA: Diagnosis not present

## 2023-10-12 DIAGNOSIS — Z87891 Personal history of nicotine dependence: Secondary | ICD-10-CM | POA: Diagnosis not present

## 2023-10-12 DIAGNOSIS — R0600 Dyspnea, unspecified: Secondary | ICD-10-CM | POA: Diagnosis not present

## 2023-10-12 DIAGNOSIS — Z79899 Other long term (current) drug therapy: Secondary | ICD-10-CM | POA: Diagnosis not present

## 2023-10-12 DIAGNOSIS — G8929 Other chronic pain: Secondary | ICD-10-CM | POA: Diagnosis not present

## 2023-10-12 DIAGNOSIS — Z90710 Acquired absence of both cervix and uterus: Secondary | ICD-10-CM | POA: Diagnosis not present

## 2023-10-12 DIAGNOSIS — T474X5A Adverse effect of other laxatives, initial encounter: Secondary | ICD-10-CM | POA: Diagnosis not present

## 2023-10-12 DIAGNOSIS — R102 Pelvic and perineal pain: Secondary | ICD-10-CM | POA: Diagnosis not present

## 2023-10-12 DIAGNOSIS — K5900 Constipation, unspecified: Secondary | ICD-10-CM | POA: Diagnosis not present

## 2023-10-12 DIAGNOSIS — K449 Diaphragmatic hernia without obstruction or gangrene: Secondary | ICD-10-CM | POA: Diagnosis not present

## 2023-10-12 DIAGNOSIS — Z791 Long term (current) use of non-steroidal anti-inflammatories (NSAID): Secondary | ICD-10-CM | POA: Diagnosis not present

## 2023-10-12 DIAGNOSIS — K51311 Ulcerative (chronic) rectosigmoiditis with rectal bleeding: Secondary | ICD-10-CM | POA: Diagnosis not present

## 2023-10-12 DIAGNOSIS — F4310 Post-traumatic stress disorder, unspecified: Secondary | ICD-10-CM | POA: Diagnosis not present

## 2023-10-12 DIAGNOSIS — K921 Melena: Secondary | ICD-10-CM | POA: Diagnosis not present

## 2023-10-12 DIAGNOSIS — K51919 Ulcerative colitis, unspecified with unspecified complications: Secondary | ICD-10-CM | POA: Diagnosis not present

## 2023-10-13 DIAGNOSIS — K5190 Ulcerative colitis, unspecified, without complications: Secondary | ICD-10-CM | POA: Diagnosis not present

## 2023-10-13 DIAGNOSIS — R700 Elevated erythrocyte sedimentation rate: Secondary | ICD-10-CM | POA: Diagnosis not present

## 2023-10-13 DIAGNOSIS — R102 Pelvic and perineal pain: Secondary | ICD-10-CM | POA: Diagnosis not present

## 2023-10-13 DIAGNOSIS — G8929 Other chronic pain: Secondary | ICD-10-CM | POA: Diagnosis not present

## 2023-10-13 DIAGNOSIS — F419 Anxiety disorder, unspecified: Secondary | ICD-10-CM | POA: Diagnosis not present

## 2023-10-13 DIAGNOSIS — K51011 Ulcerative (chronic) pancolitis with rectal bleeding: Secondary | ICD-10-CM | POA: Diagnosis not present

## 2023-10-13 DIAGNOSIS — R1011 Right upper quadrant pain: Secondary | ICD-10-CM | POA: Diagnosis not present

## 2023-10-13 DIAGNOSIS — R1031 Right lower quadrant pain: Secondary | ICD-10-CM | POA: Diagnosis not present

## 2023-10-13 DIAGNOSIS — F909 Attention-deficit hyperactivity disorder, unspecified type: Secondary | ICD-10-CM | POA: Diagnosis not present

## 2023-10-13 LAB — HM HIV SCREENING LAB: HM HIV Screening: NEGATIVE

## 2023-10-13 LAB — HM HEPATITIS C SCREENING LAB: HM Hepatitis Screen: NEGATIVE

## 2023-10-14 DIAGNOSIS — G8929 Other chronic pain: Secondary | ICD-10-CM | POA: Diagnosis not present

## 2023-10-14 DIAGNOSIS — K51919 Ulcerative colitis, unspecified with unspecified complications: Secondary | ICD-10-CM | POA: Diagnosis not present

## 2023-10-14 DIAGNOSIS — F419 Anxiety disorder, unspecified: Secondary | ICD-10-CM | POA: Diagnosis not present

## 2023-10-14 DIAGNOSIS — R102 Pelvic and perineal pain: Secondary | ICD-10-CM | POA: Diagnosis not present

## 2023-10-14 DIAGNOSIS — E279 Disorder of adrenal gland, unspecified: Secondary | ICD-10-CM | POA: Insufficient documentation

## 2023-10-15 DIAGNOSIS — F419 Anxiety disorder, unspecified: Secondary | ICD-10-CM | POA: Diagnosis not present

## 2023-10-15 DIAGNOSIS — F909 Attention-deficit hyperactivity disorder, unspecified type: Secondary | ICD-10-CM | POA: Diagnosis not present

## 2023-10-15 DIAGNOSIS — K5190 Ulcerative colitis, unspecified, without complications: Secondary | ICD-10-CM | POA: Diagnosis not present

## 2023-10-15 DIAGNOSIS — G8929 Other chronic pain: Secondary | ICD-10-CM | POA: Diagnosis not present

## 2023-10-15 DIAGNOSIS — R102 Pelvic and perineal pain: Secondary | ICD-10-CM | POA: Diagnosis not present

## 2023-10-16 DIAGNOSIS — R102 Pelvic and perineal pain: Secondary | ICD-10-CM | POA: Diagnosis not present

## 2023-10-16 DIAGNOSIS — F909 Attention-deficit hyperactivity disorder, unspecified type: Secondary | ICD-10-CM | POA: Diagnosis not present

## 2023-10-16 DIAGNOSIS — K5190 Ulcerative colitis, unspecified, without complications: Secondary | ICD-10-CM | POA: Diagnosis not present

## 2023-10-16 DIAGNOSIS — G8929 Other chronic pain: Secondary | ICD-10-CM | POA: Diagnosis not present

## 2023-10-16 DIAGNOSIS — F419 Anxiety disorder, unspecified: Secondary | ICD-10-CM | POA: Diagnosis not present

## 2023-10-18 DIAGNOSIS — K449 Diaphragmatic hernia without obstruction or gangrene: Secondary | ICD-10-CM | POA: Diagnosis not present

## 2023-10-18 DIAGNOSIS — D649 Anemia, unspecified: Secondary | ICD-10-CM | POA: Diagnosis not present

## 2023-10-18 DIAGNOSIS — Z6837 Body mass index (BMI) 37.0-37.9, adult: Secondary | ICD-10-CM | POA: Diagnosis not present

## 2023-10-18 DIAGNOSIS — F4310 Post-traumatic stress disorder, unspecified: Secondary | ICD-10-CM | POA: Diagnosis not present

## 2023-10-18 DIAGNOSIS — F988 Other specified behavioral and emotional disorders with onset usually occurring in childhood and adolescence: Secondary | ICD-10-CM | POA: Diagnosis not present

## 2023-10-18 DIAGNOSIS — F321 Major depressive disorder, single episode, moderate: Secondary | ICD-10-CM | POA: Diagnosis not present

## 2023-10-18 DIAGNOSIS — N8003 Adenomyosis of the uterus: Secondary | ICD-10-CM | POA: Diagnosis not present

## 2023-10-18 DIAGNOSIS — Z1331 Encounter for screening for depression: Secondary | ICD-10-CM | POA: Diagnosis not present

## 2023-10-18 DIAGNOSIS — N951 Menopausal and female climacteric states: Secondary | ICD-10-CM | POA: Diagnosis not present

## 2023-10-18 DIAGNOSIS — M2550 Pain in unspecified joint: Secondary | ICD-10-CM | POA: Diagnosis not present

## 2023-10-18 DIAGNOSIS — K219 Gastro-esophageal reflux disease without esophagitis: Secondary | ICD-10-CM | POA: Diagnosis not present

## 2023-10-26 DIAGNOSIS — G4733 Obstructive sleep apnea (adult) (pediatric): Secondary | ICD-10-CM | POA: Diagnosis not present

## 2023-10-26 DIAGNOSIS — Z6837 Body mass index (BMI) 37.0-37.9, adult: Secondary | ICD-10-CM | POA: Diagnosis not present

## 2023-11-01 DIAGNOSIS — N643 Galactorrhea not associated with childbirth: Secondary | ICD-10-CM | POA: Diagnosis not present

## 2023-11-02 DIAGNOSIS — Z6836 Body mass index (BMI) 36.0-36.9, adult: Secondary | ICD-10-CM | POA: Diagnosis not present

## 2023-11-02 DIAGNOSIS — K219 Gastro-esophageal reflux disease without esophagitis: Secondary | ICD-10-CM | POA: Diagnosis not present

## 2023-11-09 DIAGNOSIS — K219 Gastro-esophageal reflux disease without esophagitis: Secondary | ICD-10-CM | POA: Diagnosis not present

## 2023-11-09 DIAGNOSIS — Z6836 Body mass index (BMI) 36.0-36.9, adult: Secondary | ICD-10-CM | POA: Diagnosis not present

## 2023-11-15 ENCOUNTER — Telehealth: Payer: Self-pay

## 2023-11-16 DIAGNOSIS — Z6836 Body mass index (BMI) 36.0-36.9, adult: Secondary | ICD-10-CM | POA: Diagnosis not present

## 2023-11-16 DIAGNOSIS — K219 Gastro-esophageal reflux disease without esophagitis: Secondary | ICD-10-CM | POA: Diagnosis not present

## 2023-11-23 ENCOUNTER — Other Ambulatory Visit: Payer: Self-pay

## 2023-11-23 NOTE — Patient Instructions (Signed)
 Visit Information  Thank you for taking time to visit with me today. Please don't hesitate to contact me if I can be of assistance to you before our next scheduled appointment.  Our next appointment is by telephone on 11/27/23 at 11am Please call the care guide team at [phone] if you need to cancel or reschedule your appointment.   Following is a copy of your care plan:   Goals Addressed             This Visit's Progress    BSW VBCI Social Work Care Plan       Problems:   Patients wants to establish Medical services  CSW Clinical Goal(s):   Over the next 30 days the Patient will establish with PCP, dental and vision.  Interventions:  Social Determinants of Health in Patient with Bipolar Disorder: SDOH assessments completed: Patients wants to establish Medical services Evaluation of current treatment plan related to unmet needs SW contacted the Physician Referral line and patient is scheduled with PA Ms. Tapia 12/21/23 at 9:20am. SW will assist at next visit with dental and vision.  Patient Goals/Self-Care Activities:  Patient will attend medical visit in September.  Plan:   Telephone follow up appointment with care management team member scheduled for:  11/27/23 at 11am.        Please call 911 if you are experiencing a Mental Health or Behavioral Health Crisis or need someone to talk to.  Patient verbalizes understanding of instructions and care plan provided today and agrees to view in MyChart. Active MyChart status and patient understanding of how to access instructions and care plan via MyChart confirmed with patient.     Tillman Gardener, BSW [place]  [facility], [facility] Social Worker Direct Dial: [phone]  Fax: [phone] Website: [email or web address]

## 2023-11-23 NOTE — Patient Outreach (Signed)
 Complex Care Management   Visit Note  11/23/2023  Name:  Annette Snyder MRN: [id number] DOB: 09/28/1980  Situation: Referral received for Complex Care Management related to establish medical care I obtained verbal consent from Patient.  Visit completed with patient  on the phone  Background:   Past Medical History:  Diagnosis Date   Anxiety    Bipolar disorder (HCC)    Chronic back pain    Colitis    PTSD (post-traumatic stress disorder)     Assessment:  Patient reports she is seen at [facility] but wants a local provider.  SW contacted the Physician Referral line and patient is scheduled with PA Ms. Tapia 12/21/23 at 9:20am. SW will assist at next visit with dental and vision. Patient also request information on assistance with power bill that is due this week.  SW directs patient to DSS.  SDOH Interventions    Flowsheet Row Patient Outreach Telephone from 11/23/2023 in [place] POPULATION HEALTH DEPARTMENT  SDOH Interventions   Food Insecurity Interventions Intervention Not Indicated  Housing Interventions Intervention Not Indicated  Transportation Interventions Intervention Not Indicated  Utilities Interventions Intervention Not Indicated  Financial Strain Interventions Intervention Not Indicated      Recommendation:   None  Follow Up Plan:   Telephone follow up appointment date/time:  11/27/23 at 11am  Tillman Gardener, BSW [place]  [facility], [facility] Social Worker Direct Dial: [phone]  Fax: [phone] Website: [email or web address]

## 2023-11-24 DIAGNOSIS — Z6836 Body mass index (BMI) 36.0-36.9, adult: Secondary | ICD-10-CM | POA: Diagnosis not present

## 2023-11-24 DIAGNOSIS — E559 Vitamin D deficiency, unspecified: Secondary | ICD-10-CM | POA: Diagnosis not present

## 2023-11-24 DIAGNOSIS — D649 Anemia, unspecified: Secondary | ICD-10-CM | POA: Diagnosis not present

## 2023-11-24 DIAGNOSIS — K219 Gastro-esophageal reflux disease without esophagitis: Secondary | ICD-10-CM | POA: Diagnosis not present

## 2023-11-27 ENCOUNTER — Other Ambulatory Visit: Payer: Self-pay

## 2023-11-27 NOTE — Patient Outreach (Signed)
 Complex Care Management   Visit Note  11/27/2023  Name:  Annette Snyder MRN: [id number] DOB: Jul 06, 1980  Situation: Referral received for Complex Care Management related to SDOH Barriers:  Medical provider I obtained verbal consent from Patient.  Visit completed with patient  on the phone  Background:   Past Medical History:  Diagnosis Date   Anxiety    Bipolar disorder (HCC)    Chronic back pain    Colitis    PTSD (post-traumatic stress disorder)     Assessment:  Patient has selected a vision provider [facility]. SW t/c Medicaid and patient is provided 2 options. SW emailed patient the contact information for dental providers. Patient was able to obtain funds to pay power bill.   SDOH Interventions    Flowsheet Row Patient Outreach Telephone from 11/23/2023 in [place] POPULATION HEALTH DEPARTMENT  SDOH Interventions   Food Insecurity Interventions Intervention Not Indicated  Housing Interventions Intervention Not Indicated  Transportation Interventions Intervention Not Indicated  Utilities Interventions Intervention Not Indicated  Financial Strain Interventions Intervention Not Indicated      Recommendation:   None  Follow Up Plan:   Telephone follow up appointment date/time:  11/30/23 at 3pm  Tillman Gardener, BSW [place]  [facility], [facility] Social Worker Direct Dial: [phone]  Fax: [phone] Website: [email or web address]

## 2023-11-27 NOTE — Patient Instructions (Signed)
 Visit Information  Thank you for taking time to visit with me today. Please don't hesitate to contact me if I can be of assistance to you before our next scheduled appointment.  Your next care management appointment is by telephone on 11/30/23 at 3Pm    Please call the care guide team at [phone] if you need to cancel, schedule, or reschedule an appointment.   Please call 911 if you are experiencing a Mental Health or Behavioral Health Crisis or need someone to talk to.  Tillman Gardener, BSW [place]  [facility], [facility] Social Worker Direct Dial: [phone]  Fax: [phone] Website: [email or web address]

## 2023-11-29 DIAGNOSIS — E669 Obesity, unspecified: Secondary | ICD-10-CM | POA: Diagnosis not present

## 2023-11-29 DIAGNOSIS — F17210 Nicotine dependence, cigarettes, uncomplicated: Secondary | ICD-10-CM | POA: Diagnosis not present

## 2023-11-29 DIAGNOSIS — K626 Ulcer of anus and rectum: Secondary | ICD-10-CM | POA: Diagnosis not present

## 2023-11-29 DIAGNOSIS — K449 Diaphragmatic hernia without obstruction or gangrene: Secondary | ICD-10-CM | POA: Diagnosis not present

## 2023-11-29 DIAGNOSIS — K5120 Ulcerative (chronic) proctitis without complications: Secondary | ICD-10-CM | POA: Diagnosis not present

## 2023-11-29 DIAGNOSIS — K921 Melena: Secondary | ICD-10-CM | POA: Diagnosis not present

## 2023-11-29 DIAGNOSIS — E041 Nontoxic single thyroid nodule: Secondary | ICD-10-CM | POA: Diagnosis not present

## 2023-11-29 DIAGNOSIS — K6389 Other specified diseases of intestine: Secondary | ICD-10-CM | POA: Diagnosis not present

## 2023-11-29 DIAGNOSIS — G43909 Migraine, unspecified, not intractable, without status migrainosus: Secondary | ICD-10-CM | POA: Diagnosis not present

## 2023-11-29 DIAGNOSIS — K51311 Ulcerative (chronic) rectosigmoiditis with rectal bleeding: Secondary | ICD-10-CM | POA: Diagnosis not present

## 2023-11-29 DIAGNOSIS — Z888 Allergy status to other drugs, medicaments and biological substances status: Secondary | ICD-10-CM | POA: Diagnosis not present

## 2023-11-29 DIAGNOSIS — K635 Polyp of colon: Secondary | ICD-10-CM | POA: Diagnosis not present

## 2023-11-29 DIAGNOSIS — K625 Hemorrhage of anus and rectum: Secondary | ICD-10-CM | POA: Diagnosis not present

## 2023-11-29 DIAGNOSIS — Z6835 Body mass index (BMI) 35.0-35.9, adult: Secondary | ICD-10-CM | POA: Diagnosis not present

## 2023-11-29 DIAGNOSIS — K648 Other hemorrhoids: Secondary | ICD-10-CM | POA: Diagnosis not present

## 2023-11-29 DIAGNOSIS — D123 Benign neoplasm of transverse colon: Secondary | ICD-10-CM | POA: Diagnosis not present

## 2023-11-29 DIAGNOSIS — Z8719 Personal history of other diseases of the digestive system: Secondary | ICD-10-CM | POA: Diagnosis not present

## 2023-11-29 DIAGNOSIS — Z90710 Acquired absence of both cervix and uterus: Secondary | ICD-10-CM | POA: Diagnosis not present

## 2023-11-29 DIAGNOSIS — K2100 Gastro-esophageal reflux disease with esophagitis, without bleeding: Secondary | ICD-10-CM | POA: Diagnosis not present

## 2023-11-30 ENCOUNTER — Other Ambulatory Visit: Payer: Self-pay

## 2023-11-30 NOTE — Patient Instructions (Signed)
 Visit Information  Thank you for taking time to visit with me today. Please don't hesitate to contact me if I can be of assistance to you before our next scheduled appointment.  Your next care management appointment is by telephone on 12/05/23 at 2:30 pm    Please call the care guide team at [phone] if you need to cancel, schedule, or reschedule an appointment.   Please call 911 if you are experiencing a Mental Health or Behavioral Health Crisis or need someone to talk to.  Tillman Gardener, BSW [place]  [facility], [facility] Social Worker Direct Dial: [phone]  Fax: [phone] Website: [email or web address]

## 2023-11-30 NOTE — Patient Outreach (Signed)
 Complex Care Management   Visit Note  11/30/2023  Name:  Annette Snyder MRN: [id number] DOB: June 01, 1980  Situation: Referral received for Complex Care Management related to SDOH Barriers:  Dental/vision I obtained verbal consent from Patient.  Visit completed with patient  on the phone  Background:   Past Medical History:  Diagnosis Date   Anxiety    Bipolar disorder (HCC)    Chronic back pain    Colitis    PTSD (post-traumatic stress disorder)     Assessment:  Patient reports she is recovering from a recent hospital stay.  She would like to talk another day.  SDOH Interventions    Flowsheet Row Patient Outreach Telephone from 11/23/2023 in [place] POPULATION HEALTH DEPARTMENT  SDOH Interventions   Food Insecurity Interventions Intervention Not Indicated  Housing Interventions Intervention Not Indicated  Transportation Interventions Intervention Not Indicated  Utilities Interventions Intervention Not Indicated  Financial Strain Interventions Intervention Not Indicated      Recommendation:   None  Follow Up Plan:   Telephone follow up appointment date/time:  12/05/23 at 2:30pm  Tillman Gardener, BSW [place]  [facility] Social Worker Direct Dial: [phone]  Fax: [phone] Website: [email or web address]

## 2023-12-04 DIAGNOSIS — Z6836 Body mass index (BMI) 36.0-36.9, adult: Secondary | ICD-10-CM | POA: Diagnosis not present

## 2023-12-04 DIAGNOSIS — E559 Vitamin D deficiency, unspecified: Secondary | ICD-10-CM | POA: Diagnosis not present

## 2023-12-05 ENCOUNTER — Telehealth

## 2023-12-08 ENCOUNTER — Other Ambulatory Visit: Payer: Self-pay

## 2023-12-08 NOTE — Patient Instructions (Signed)

## 2023-12-08 NOTE — Patient Outreach (Signed)
 Complex Care Management   Visit Note  12/08/2023  Name:  Annette Snyder MRN: [id number] DOB: 11/06/1980  Situation: Referral received for Complex Care Management related to SDOH Barriers:  Medical, dental, vision provider I obtained verbal consent from Patient.  Visit completed with Patient  on the phone  Background:   Past Medical History:  Diagnosis Date   Anxiety    Bipolar disorder (HCC)    Chronic back pain    Colitis    PTSD (post-traumatic stress disorder)     Assessment:  Patient has selected an eye, dental and medial provider.  Patient will follow up with new provider if additional SW service is needed.    SDOH Interventions    Flowsheet Row Patient Outreach Telephone from 11/23/2023 in [place] POPULATION HEALTH DEPARTMENT  SDOH Interventions   Food Insecurity Interventions Intervention Not Indicated  Housing Interventions Intervention Not Indicated  Transportation Interventions Intervention Not Indicated  Utilities Interventions Intervention Not Indicated  Financial Strain Interventions Intervention Not Indicated     Recommendation:   None  Follow Up Plan:   Patient has met all care management goals. Care Management case will be closed. Patient has been provided contact information should new needs arise.   Tillman Gardener, BSW [place]  [facility], [facility] Social Worker Direct Dial: [phone]  Fax: [phone] Website: [email or web address]

## 2023-12-12 DIAGNOSIS — Z90710 Acquired absence of both cervix and uterus: Secondary | ICD-10-CM | POA: Diagnosis not present

## 2023-12-21 ENCOUNTER — Ambulatory Visit: Admitting: Family Medicine

## 2023-12-21 DIAGNOSIS — Z6835 Body mass index (BMI) 35.0-35.9, adult: Secondary | ICD-10-CM | POA: Diagnosis not present

## 2023-12-21 DIAGNOSIS — K219 Gastro-esophageal reflux disease without esophagitis: Secondary | ICD-10-CM | POA: Diagnosis not present

## 2023-12-22 ENCOUNTER — Other Ambulatory Visit: Payer: Self-pay

## 2023-12-22 NOTE — Patient Instructions (Signed)

## 2023-12-22 NOTE — Patient Outreach (Signed)
 Complex Care Management   Visit Note  12/22/2023  Name:  Annette Snyder MRN: [id number] DOB: 04-06-1981  Situation: Referral received for Complex Care Management related to SDOH Barriers:  Medical appointment I obtained verbal consent from Patient.  Visit completed with Patient  on the phone  Background:   Past Medical History:  Diagnosis Date   Anxiety    Bipolar disorder (HCC)    Chronic back pain    Colitis    PTSD (post-traumatic stress disorder)     Assessment:  Patient reports that the provider rescheduled her initial visit and will be seen 01/10/24.  Patient states she does not need a follow up.  SDOH Interventions    Flowsheet Row Patient Outreach Telephone from 11/23/2023 in [place] POPULATION HEALTH DEPARTMENT  SDOH Interventions   Food Insecurity Interventions Intervention Not Indicated  Housing Interventions Intervention Not Indicated  Transportation Interventions Intervention Not Indicated  Utilities Interventions Intervention Not Indicated  Financial Strain Interventions Intervention Not Indicated      Recommendation:   None  Follow Up Plan:   Patient has met all care management goals. Care Management case will be closed. Patient has been provided contact information should new needs arise.   Tillman Gardener, BSW   [facility], [facility] Social Worker Direct Dial: [phone]  Fax: [phone] Website: [email or web address]

## 2024-01-04 DIAGNOSIS — Z6834 Body mass index (BMI) 34.0-34.9, adult: Secondary | ICD-10-CM | POA: Diagnosis not present

## 2024-01-04 DIAGNOSIS — K219 Gastro-esophageal reflux disease without esophagitis: Secondary | ICD-10-CM | POA: Diagnosis not present

## 2024-01-04 DIAGNOSIS — E559 Vitamin D deficiency, unspecified: Secondary | ICD-10-CM | POA: Diagnosis not present

## 2024-01-10 ENCOUNTER — Ambulatory Visit: Admitting: Family Medicine

## 2024-02-01 DIAGNOSIS — Z6833 Body mass index (BMI) 33.0-33.9, adult: Secondary | ICD-10-CM | POA: Diagnosis not present

## 2024-02-01 DIAGNOSIS — F321 Major depressive disorder, single episode, moderate: Secondary | ICD-10-CM | POA: Diagnosis not present

## 2024-02-22 DIAGNOSIS — Z6833 Body mass index (BMI) 33.0-33.9, adult: Secondary | ICD-10-CM | POA: Diagnosis not present

## 2024-02-22 DIAGNOSIS — E782 Mixed hyperlipidemia: Secondary | ICD-10-CM | POA: Diagnosis not present
